# Patient Record
Sex: Female | Born: 1995 | Race: Black or African American | Hispanic: No | Marital: Single | Smoking: Never smoker
Health system: Southern US, Community
[De-identification: ages and names within clinical notes are randomized; demographics above are authoritative.]

## PROBLEM LIST (undated history)

## (undated) DIAGNOSIS — E669 Obesity, unspecified: Secondary | ICD-10-CM

## (undated) DIAGNOSIS — I10 Essential (primary) hypertension: Secondary | ICD-10-CM

## (undated) DIAGNOSIS — L83 Acanthosis nigricans: Secondary | ICD-10-CM

## (undated) HISTORY — DX: Acanthosis nigricans: L83

## (undated) HISTORY — DX: Obesity, unspecified: E66.9

---
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
## 2017-01-01 ENCOUNTER — Encounter (INDEPENDENT_AMBULATORY_CARE_PROVIDER_SITE_OTHER): Payer: Self-pay | Admitting: Family

## 2017-01-01 ENCOUNTER — Ambulatory Visit (INDEPENDENT_AMBULATORY_CARE_PROVIDER_SITE_OTHER): Payer: Medicaid Other | Admitting: Family

## 2017-01-01 VITALS — BP 132/70 | HR 108 | Wt 178.4 lb

## 2017-01-01 DIAGNOSIS — L83 Acanthosis nigricans: Secondary | ICD-10-CM

## 2017-01-01 DIAGNOSIS — I10 Essential (primary) hypertension: Secondary | ICD-10-CM | POA: Diagnosis not present

## 2017-01-01 DIAGNOSIS — Z68.41 Body mass index (BMI) pediatric, greater than or equal to 95th percentile for age: Secondary | ICD-10-CM

## 2017-01-01 DIAGNOSIS — IMO0001 Reserved for inherently not codable concepts without codable children: Secondary | ICD-10-CM

## 2017-01-01 DIAGNOSIS — E669 Obesity, unspecified: Secondary | ICD-10-CM

## 2017-01-01 DIAGNOSIS — F54 Psychological and behavioral factors associated with disorders or diseases classified elsewhere: Secondary | ICD-10-CM

## 2017-01-01 DIAGNOSIS — E1065 Type 1 diabetes mellitus with hyperglycemia: Secondary | ICD-10-CM | POA: Diagnosis not present

## 2017-01-01 LAB — POCT GLUCOSE (DEVICE FOR HOME USE): POC Glucose: 98 mg/dl (ref 70–99)

## 2017-01-01 LAB — POCT GLYCOSYLATED HEMOGLOBIN (HGB A1C): HEMOGLOBIN A1C: 8.7

## 2017-01-01 NOTE — Patient Instructions (Addendum)
Basal Changes  12am: 1.35--> 1.30  4am: 1.40--> 1.35 8am: 1.30--> 1.25 5pm: 1.45  Sensitivity Changes  8am:30--> 35 10am: 40  10pm: 50   Will write letter to have water with you at all times. Also keep snacks. Heat is fine   Follow up in 3 months

## 2017-01-01 NOTE — Progress Notes (Signed)
Pediatric Endocrinology Diabetes Consultation Follow-up Visit  Chief Complaint: Follow-up type 1 diabetes  Little, Effie Shy, MD   HPI: Brandy Parks  is a 21 y.o. female presenting for follow-up of type 1 diabetes.  She is accompanied to this visit by her mother.  11. Ricci was admitted to the [facility] on 10/01/10 with new onset DM. She had hyperglycemia to 491 but not DKA. Her HbA1c was 12.8% and her C-peptide was 0.64 (normal 0.8-3.0). Makynlee was short, quite obese, and had acanthosis nigricans c/w T2DM. However, her insulin requirement was more c/w T1DM and her GAD was positive at 13.5 and her Pancreatic Islet Cell Ab was positive at >80. She was started on MDI with Lantus and Novolog. She was transitioned to Medtronic 530G insulin pump on 12/26/13.  2. Since last visit to [facility] on 03/18, she has been well. Liliyana states that things have been going pretty well.   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 She reports that she has noticed how much her blood sugars impact her work now. When she is high or low, her teachers get very concerned and sometimes wont let her continue with the days work. She feels like she has a lot of fluctuation in her blood sugars based on how busy her day is. She is wearing Medtronic 530g insulin pump, she does not wear a CGM sensor currently. She reports she is changing her site every 3/4 days.   She recently was seen in the ER for abscess on her leg and underarm. The abscess to her leg was drained and is healing fine. The abscess under her arm has resolved. She is taking 2.5 mg of Lisinopril but forgets to take it 3 days per week.   Insulin regimen: Basal Rates 12AM 1.35  4AM 1.40  8AM 1.30  530pm 1.45       Insulin to Carbohydrate Ratio 12AM 15  6AM 9  8:30AM 10          Insulin Sensitivity Factor 12AM 50  8AM  30  10AM 40         Target Blood Glucose 12AM 150  6AM 120  8AM 150         Hypoglycemia: Able to  feel low blood sugars.  No glucagon needed recently.  Blood glucose download: Checking Bg 3.7 times per day. Avg Bg 206  - She is using 56% basal insulin and 44% bolus insulin. She is bolusing 2.4 times per day on average.   - She is Above range 51%, Below range 14%   - Pattern of lows in the morning.  Med-alert ID: Not currently wearing. Pump sites: Using abdomen and buttocks.  Changing sites every 3 days Annual labs due: 06/2017 Ophthalmology:  Had eval 03/2015; required new prescription but no concerns of retinopathy   3. ROS: Greater than 10 systems reviewed with pertinent positives listed in HPI, otherwise neg. General: Reports good energy and good appetite.  Eyes: Recent change in prescription glasses, no retinopathy Respiratory: Denies SOB, cough, congestion Cardiac: Denies tachycardia, palpitations, chest pain.  Genitourinary: No polyuria or polydipsia.  Periods are monthly Psychiatric: Normal affect. Denies anxiety and depression.  Skin: No rash, or skin breakdown.  Neuro: denies tingling of feet currently, no pain Endo: denies polyuria, polydipsia.   Past Medical History:   Past Medical History:  Diagnosis Date  . Acanthosis nigricans, acquired   . Diabetes mellitus   . Hypertension   . Obesity     Current  Outpatient Prescriptions on File Prior to Visit  Medication Sig Dispense Refill  . ACCU-CHEK FASTCLIX LANCETS MISC 1 each by Does not apply route as directed. Check sugar 6 x daily 204 each 6  . glucagon 1 MG injection Use for Severe Hypoglycemia . Inject '1mg'$  intramuscularly if unresponsive, unable to swallow, unconscious and/or has seizure 2 kit 2  . glucose blood (BAYER CONTOUR NEXT TEST) test strip Check glucose 6x daily connects to insulin pump 600 each 4  . Insulin Human (INSULIN PUMP) SOLN Inject into the skin continuous.    Marland Kitchen lisinopril (PRINIVIL,ZESTRIL) 2.5 MG tablet take 1 tablet by mouth once daily 30 tablet 5  . NOVOLOG 100 UNIT/ML injection USE 300 UNITS  IN INSULIN PUMP EVERY 48 HOURS 50 mL 6  . cephALEXin (KEFLEX) 500 MG capsule Take 1 capsule (500 mg total) by mouth 4 (four) times daily. (Patient not taking: Reported on 01/01/2017) 28 capsule 0  . cyclobenzaprine (FLEXERIL) 5 MG tablet Take 1 tablet (5 mg total) by mouth 3 (three) times daily as needed for muscle spasms. (Patient not taking: Reported on 07/02/2016) 12 tablet 0  . ibuprofen (ADVIL,MOTRIN) 600 MG tablet Take 1 tablet (600 mg total) by mouth every 8 (eight) hours as needed for mild pain. (Patient not taking: Reported on 07/02/2016) 30 tablet 0  . loratadine (CLARITIN) 10 MG tablet Take 1 tablet (10 mg total) by mouth daily. (Patient not taking: Reported on 07/02/2016) 30 tablet 0  . NOVOLOG FLEXPEN 100 UNIT/ML FlexPen INJECT 50 UNITS 3 TIME DAILY WITH MEALS (Patient not taking: Reported on 09/30/2016) 5 pen 6  . ranitidine (ZANTAC) 150 MG tablet Take 1 tablet (150 mg total) by mouth 2 (two) times daily. (Patient not taking: Reported on 09/30/2016) 60 tablet 6   No current facility-administered medications on file prior to visit.     No Known Allergies  Surgical History: No past surgical history on file.  Family History:  Family History  Problem Relation Age of Onset  . Obesity Mother   . Hypothyroidism Mother   . Hypertension Mother   . Obesity Maternal Aunt   . Hypertension Maternal Aunt   . Obesity Maternal Grandmother   . Hypertension Maternal Grandmother   . Kidney disease Maternal Grandmother   . Heart disease Maternal Grandfather   . Hypertension Maternal Grandfather   . Cancer Paternal Grandfather   . Diabetes Maternal Uncle   . Healthy Father      Social History: Lives with: maternal grandparents, mother [employer]. Wants to start Cosmetology school   Physical Exam:  Vitals:   01/01/17 0934  BP: 132/70  Pulse: (!) 108  Weight: 178 lb 6.4 oz (80.9 kg)   BP 132/70   Pulse (!) 108   Wt 178 lb 6.4 oz (80.9 kg)   BMI 37.19 kg/m  Body mass index:  body mass index is 37.19 kg/m. No height on file for this encounter.  PHYSICAL EXAM  General: Well developed, obese African American female in no acute distress.  She appears stated age.  Head: Normocephalic, atraumatic.   Eyes:  Pupils equal and round. EOMI.   Sclera white.  No eye drainage.   Ears/Nose/Mouth/Throat: Nares patent, no nasal drainage.  Normal dentition, mucous membranes moist.  Oropharynx intact. Neck: supple, no cervical lymphadenopathy, no thyromegaly Cardiovascular: regular rate, normal S1/S2, no murmurs Respiratory: No increased work of breathing.  Lungs clear to auscultation bilaterally.  No wheezes. Abdomen: soft, nontender, nondistended. Normal bowel sounds.  No appreciable masses.  Extremities: warm, well perfused, cap refill < 2 sec.   Musculoskeletal: Normal muscle mass.  Normal strength.  No foot deformities or tenderness to palpation Skin: warm, dry.  No rash.  No discharge or erythema present.  Neurologic: alert and oriented, normal speech and gait. Sensation grossly intact in feet bilaterally  Labs:   Results for orders placed or performed in visit on 01/01/17  POCT HgB A1C  Result Value Ref Range   Hemoglobin A1C 8.7   POCT Glucose (Device for Home Use)  Result Value Ref Range   Glucose Fasting, POC  70 - 99 mg/dL   POC Glucose 98 70 - 99 mg/dl    Assessment/Plan: Kimiko is a 21 y.o. female with type 1 diabetes in sub optimal control. She is having more lows recently, especially in the morning following correction doses late at night. She needs to decrease her basal rate and sensitivity factors. She also needs to make sure to bolus at dinner to avoid hyperglycemia prior to bedtime.    1. Type I diabetes mellitus, uncontrolled - POCT Glucose (CBG) obtained today; A1C as above.  - Check Bg at least 4 times per day.  - Bolus for blood sugars and for carbs.  - Keep glucose with you at all times.  - Educated on importance of bolusing prior to meals. -  Reviewed pump and blood sugar download.    2. Insulin pump titration Basal Rates 12AM 1.35--> 1.30   4AM 1.40--> 1.35  8AM 1.30--> 1.25  530pm 1.45       Insulin to Carbohydrate Ratio: NO CHANGE 12AM 15  6AM 9  8:30AM 10          Insulin Sensitivity Factor 12AM 50  8AM  30--> 35  10AM 40  10 PM 50       3. Maladaptive Behaviors - Discussed importance of diabetes care and barriers to performing diabetes care.  - Encouraged to bolus before meal to avoid forgetting to bolus after meal.  - Answered all questions   4. Hypertension Encouraged to take Lisinopril daily and not miss doses  - Discussed risk factors of HTN with diabetes.   5. Obesity  - Discussed healthy life habits such as daily exercise, better diet and limiting the amount she goes out to eat.     Follow-up:   3 months.     Hermenia Bers, FNP-C  This visit lasted >25 minutes. More then 50% of the visit was devoted to counseling and education.

## 2017-01-12 ENCOUNTER — Telehealth (INDEPENDENT_AMBULATORY_CARE_PROVIDER_SITE_OTHER): Payer: Self-pay | Admitting: Family

## 2017-01-12 NOTE — Telephone Encounter (Signed)
°  Who's calling (name and relationship to patient) : Alan RipperKandrick (mom) Best contact number: [phone] Provider they see: Ovidio [facility]enser  Reason for call: [facility].  She stated she can pick up when ready.  Please call.     PRESCRIPTION REFILL ONLY  Name of prescription:  Pharmacy:

## 2017-01-13 ENCOUNTER — Encounter (INDEPENDENT_AMBULATORY_CARE_PROVIDER_SITE_OTHER): Payer: Self-pay | Admitting: *Deleted

## 2017-01-13 NOTE — Telephone Encounter (Signed)
Spoke to mother, advised letter is ready to be picked up.

## 2017-02-17 ENCOUNTER — Emergency Department (HOSPITAL_COMMUNITY)
Admission: EM | Admit: 2017-02-17 | Discharge: 2017-02-17 | Disposition: A | Discharge status: Home / Self Care | Payer: Medicaid Other | Attending: Emergency Medicine | Admitting: Emergency Medicine

## 2017-02-17 ENCOUNTER — Encounter (HOSPITAL_COMMUNITY): Payer: Self-pay | Admitting: Emergency Medicine

## 2017-02-17 DIAGNOSIS — I10 Essential (primary) hypertension: Secondary | ICD-10-CM | POA: Insufficient documentation

## 2017-02-17 DIAGNOSIS — Z9641 Presence of insulin pump (external) (internal): Secondary | ICD-10-CM | POA: Diagnosis not present

## 2017-02-17 DIAGNOSIS — R739 Hyperglycemia, unspecified: Secondary | ICD-10-CM | POA: Diagnosis present

## 2017-02-17 DIAGNOSIS — E1065 Type 1 diabetes mellitus with hyperglycemia: Secondary | ICD-10-CM | POA: Insufficient documentation

## 2017-02-17 LAB — COMPREHENSIVE METABOLIC PANEL
ALT: 14 U/L (ref 14–54)
ANION GAP: 12 (ref 5–15)
AST: 15 U/L (ref 15–41)
Albumin: 3.5 g/dL (ref 3.5–5.0)
Alkaline Phosphatase: 84 U/L (ref 38–126)
BUN: 9 mg/dL (ref 6–20)
CHLORIDE: 99 mmol/L — AB (ref 101–111)
CO2: 21 mmol/L — AB (ref 22–32)
Calcium: 9.5 mg/dL (ref 8.9–10.3)
Creatinine, Ser: 0.78 mg/dL (ref 0.44–1.00)
Glucose, Bld: 552 mg/dL (ref 65–99)
POTASSIUM: 4.3 mmol/L (ref 3.5–5.1)
SODIUM: 132 mmol/L — AB (ref 135–145)
Total Bilirubin: 0.5 mg/dL (ref 0.3–1.2)
Total Protein: 6.8 g/dL (ref 6.5–8.1)

## 2017-02-17 LAB — URINALYSIS, ROUTINE W REFLEX MICROSCOPIC
BILIRUBIN URINE: NEGATIVE
HGB URINE DIPSTICK: NEGATIVE
Ketones, ur: 5 mg/dL — AB
LEUKOCYTES UA: NEGATIVE
NITRITE: NEGATIVE
PROTEIN: NEGATIVE mg/dL
Specific Gravity, Urine: 1.027 (ref 1.005–1.030)
WBC UA: NONE SEEN WBC/hpf (ref 0–5)
pH: 6 (ref 5.0–8.0)

## 2017-02-17 LAB — CBC WITH DIFFERENTIAL/PLATELET
Basophils Absolute: 0 10*3/uL (ref 0.0–0.1)
Basophils Relative: 0 %
EOS ABS: 0.2 10*3/uL (ref 0.0–0.7)
Eosinophils Relative: 2 %
HCT: 35.7 % — ABNORMAL LOW (ref 36.0–46.0)
Hemoglobin: 11.4 g/dL — ABNORMAL LOW (ref 12.0–15.0)
LYMPHS PCT: 33 %
Lymphs Abs: 2.7 10*3/uL (ref 0.7–4.0)
MCH: 25.1 pg — AB (ref 26.0–34.0)
MCHC: 31.9 g/dL (ref 30.0–36.0)
MCV: 78.6 fL (ref 78.0–100.0)
MONOS PCT: 4 %
Monocytes Absolute: 0.3 10*3/uL (ref 0.1–1.0)
NEUTROS PCT: 61 %
Neutro Abs: 5 10*3/uL (ref 1.7–7.7)
PLATELETS: 324 10*3/uL (ref 150–400)
RBC: 4.54 MIL/uL (ref 3.87–5.11)
RDW: 13.6 % (ref 11.5–15.5)
WBC: 8.2 10*3/uL (ref 4.0–10.5)

## 2017-02-17 LAB — CBG MONITORING, ED: Glucose-Capillary: 527 mg/dL (ref 65–99)

## 2017-02-17 LAB — POC URINE PREG, ED: PREG TEST UR: NEGATIVE

## 2017-02-17 MED ORDER — SODIUM CHLORIDE 0.9 % IV BOLUS (SEPSIS)
1000.0000 mL | Freq: Once | INTRAVENOUS | Status: AC
  Administered 2017-02-17: 1000 mL via INTRAVENOUS

## 2017-02-17 NOTE — ED Notes (Signed)
PT states understanding of care given, follow up care. PT ambulated from ED to car with a steady gait.  

## 2017-02-17 NOTE — ED Provider Notes (Signed)
[facility]-EMERGENCY DEPT Provider Note   CSN: [id number] Arrival date & time: 02/17/17  0009     History   Chief Complaint Chief Complaint  Patient presents with  . Hyperglycemia    HPI Brandy Parks is a 21 y.o. female.  The history is provided by the patient.  Hyperglycemia  Blood sugar level PTA:  >600 Severity:  Moderate Onset quality:  Gradual Timing:  Constant Progression:  Partially resolved Chronicity:  Recurrent Diabetes status:  Controlled with insulin Context: insulin pump use   Relieved by:  Insulin Associated symptoms: dizziness   Associated symptoms: no abdominal pain, no fever and no vomiting    Pt with known h/o Diabetes, has insulin pump in place She reports over past day she has noted increased blood glucose This usually occurs with menstrual cycle No fever/vomiting She moved her insulin pump to her leg and she confirmed it is working She reports dizziness but no other acute complaints  Past Medical History:  Diagnosis Date  . Acanthosis nigricans, acquired   . Diabetes mellitus   . Hypertension   . Obesity     Patient Active Problem List   Diagnosis Date Noted  . 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 Essential hypertension, benign   . Acquired acanthosis nigricans   . Noncompliance   . Inadequate parental supervision and control   . Type I diabetes mellitus, uncontrolled (HCC) 01/10/2015  . Skin abscess 12/06/2014  . Insulin pump titration 02/08/2014  . Nocturnal hypoglycemia 02/23/2013  . Adjustment disorder 11/18/2011  . Acanthosis nigricans, acquired   . Type I (juvenile type) diabetes mellitus without mention of complication, uncontrolled 10/30/2010  . Obesity 10/30/2010    History reviewed. No pertinent surgical history.  OB History    No data available       Home Medications    Prior to Admission medications   Medication Sig Start Date End Date Taking? Authorizing Provider  ACCU-CHEK  FASTCLIX LANCETS MISC 1 each by Does not apply route as directed. Check sugar 6 x daily 06/23/13   [name], MD  cephALEXin (KEFLEX) 500 MG capsule Take 1 capsule (500 mg total) by mouth 4 (four) times daily. Patient not taking: Reported on 01/01/2017 11/07/16   Roxy Horseman, PA-C  cyclobenzaprine (FLEXERIL) 5 MG tablet Take 1 tablet (5 mg total) by mouth 3 (three) times daily as needed for muscle spasms. Patient not taking: Reported on 07/02/2016 12/11/15   Earley Favor, NP  glucagon 1 MG injection Use for Severe Hypoglycemia . Inject 1mg  intramuscularly if unresponsive, unable to swallow, unconscious and/or has seizure 04/02/16   Gretchen Short, NP  glucose blood (BAYER CONTOUR NEXT TEST) test strip Check glucose 6x daily connects to insulin pump 12/06/14   [name], MD  ibuprofen (ADVIL,MOTRIN) 600 MG tablet Take 1 tablet (600 mg total) by mouth every 8 (eight) hours as needed for mild pain. Patient not taking: Reported on 07/02/2016 12/11/15   Earley Favor, NP  Insulin Human (INSULIN PUMP) SOLN Inject into the skin continuous.    [provider]  lisinopril (PRINIVIL,ZESTRIL) 2.5 MG tablet take 1 tablet by mouth once daily 12/25/16   [name], MD  loratadine (CLARITIN) 10 MG tablet Take 1 tablet (10 mg total) by mouth daily. Patient not taking: Reported on 07/02/2016 11/29/13   Marcellina Millin, MD  NOVOLOG 100 UNIT/ML injection USE 300 UNITS IN INSULIN PUMP EVERY 48 HOURS 12/16/16   [name], MD  NOVOLOG FLEXPEN 100 UNIT/ML FlexPen INJECT 50  UNITS 3 TIME DAILY WITH MEALS Patient not taking: Reported on 09/30/2016 06/23/16   Dessa PhiBadik, Jennifer, MD  ranitidine (ZANTAC) 150 MG tablet Take 1 tablet (150 mg total) by mouth 2 (two) times daily. Patient not taking: Reported on 09/30/2016 06/23/13   Dessa PhiBadik, Jennifer, MD    Family History Family History  Problem Relation Age of Onset  . Obesity Mother   . Hypothyroidism Mother   . Hypertension Mother   . Obesity Maternal  Aunt   . Hypertension Maternal Aunt   . Obesity Maternal Grandmother   . Hypertension Maternal Grandmother   . Kidney disease Maternal Grandmother   . Heart disease Maternal Grandfather   . Hypertension Maternal Grandfather   . Cancer Paternal Grandfather   . Diabetes Maternal Uncle   . Healthy Father     Social History Social History  Substance Use Topics  . Smoking status: Never Smoker  . Smokeless tobacco: Never Used  . Alcohol use No     Allergies   Patient has no known allergies.   Review of Systems Review of Systems  Constitutional: Negative for fever.  Gastrointestinal: Negative for abdominal pain and vomiting.  Neurological: Positive for dizziness.  All other systems reviewed and are negative.    Physical Exam Updated Vital Signs BP (!) 136/99 (BP Location: Left Arm)   Pulse 87   Temp 98.3 F (36.8 C) (Oral)   Resp 14   Ht 1.448 m (4\' 9" )   Wt 76.7 kg (169 lb)   LMP 02/17/2017 (Exact Date)   SpO2 99%   BMI 36.57 kg/m   Physical Exam CONSTITUTIONAL: Well developed/well nourished, smiling, no distress HEAD: Normocephalic/atraumatic EYES: EOMI/PERRL ENMT: Mucous membranes moist NECK: supple no meningeal signs SPINE/BACK:entire spine nontender CV: S1/S2 noted, no murmurs/rubs/gallops noted LUNGS: Lungs are clear to auscultation bilaterally, no apparent distress ABDOMEN: soft, nontender, no rebound or guarding, bowel sounds noted throughout abdomen GU:no cva tenderness NEURO: Pt is awake/alert/appropriate, moves all extremitiesx4.  No facial droop.   EXTREMITIES: pulses normal/equal, full ROM SKIN: warm, color normal PSYCH: no abnormalities of mood noted, alert and oriented to situation   ED Treatments / Results  Labs (all labs ordered are listed, but only abnormal results are displayed) Labs Reviewed  CBC WITH DIFFERENTIAL/PLATELET - Abnormal; Notable for the following:       Result Value   Hemoglobin 11.4 (*)    HCT 35.7 (*)    MCH 25.1  (*)    All other components within normal limits  COMPREHENSIVE METABOLIC PANEL - Abnormal; Notable for the following:    Sodium 132 (*)    Chloride 99 (*)    CO2 21 (*)    Glucose, Bld 552 (*)    All other components within normal limits  URINALYSIS, ROUTINE W REFLEX MICROSCOPIC - Abnormal; Notable for the following:    Color, Urine COLORLESS (*)    Glucose, UA >=500 (*)    Ketones, ur 5 (*)    Bacteria, UA RARE (*)    Squamous Epithelial / LPF 0-5 (*)    All other components within normal limits  CBG MONITORING, ED - Abnormal; Notable for the following:    Glucose-Capillary 527 (*)    All other components within normal limits  CBG MONITORING, ED  POC URINE PREG, ED    EKG  EKG Interpretation None       Radiology No results found.  Procedures Procedures (including critical care time)  Medications Ordered in ED Medications  sodium chloride 0.9 %  bolus 1,000 mL (1,000 mLs Intravenous New Bag/Given 02/17/17 0157)  sodium chloride 0.9 % bolus 1,000 mL (1,000 mLs Intravenous New Bag/Given 02/17/17 0157)     Initial Impression / Assessment and Plan / ED Course  I have reviewed the triage vital signs and the nursing notes.  Pertinent labs   results that were available during my care of the patient were reviewed by me and considered in my medical decision making (see chart for details).     1:48 AM Pt well appearing Will give IV fluids and reassess Low suspicion for DKA at this time 4:32 AM Pt improved She was resting comfortably She is able to check her glucose with her insulin pump and it is now in the 300s Will d/c home We discussed strict ER return precautions   Final Clinical Impressions(s) / ED Diagnoses   Final diagnoses:  Hyperglycemia    New Prescriptions New Prescriptions   No medications on file     Zadie Rhine, MD 02/17/17 [phone]

## 2017-02-17 NOTE — ED Notes (Signed)
ED Provider at bedside. 

## 2017-02-17 NOTE — ED Triage Notes (Signed)
Pt to ED with c/o hyperglycemia.  Pt st's her meter was reading high.  Pt is on a insulin pump and has adjusted it but sugar continues to be high.

## 2017-04-03 ENCOUNTER — Encounter (INDEPENDENT_AMBULATORY_CARE_PROVIDER_SITE_OTHER): Payer: Self-pay | Admitting: Family

## 2017-04-03 ENCOUNTER — Ambulatory Visit (INDEPENDENT_AMBULATORY_CARE_PROVIDER_SITE_OTHER): Payer: Medicaid Other | Admitting: Family

## 2017-04-03 VITALS — BP 130/88 | HR 80 | Ht 58.47 in | Wt 176.0 lb

## 2017-04-03 DIAGNOSIS — E669 Obesity, unspecified: Secondary | ICD-10-CM

## 2017-04-03 DIAGNOSIS — E1065 Type 1 diabetes mellitus with hyperglycemia: Secondary | ICD-10-CM

## 2017-04-03 DIAGNOSIS — Z68.41 Body mass index (BMI) pediatric, greater than or equal to 95th percentile for age: Secondary | ICD-10-CM | POA: Diagnosis not present

## 2017-04-03 DIAGNOSIS — I10 Essential (primary) hypertension: Secondary | ICD-10-CM

## 2017-04-03 DIAGNOSIS — IMO0001 Reserved for inherently not codable concepts without codable children: Secondary | ICD-10-CM

## 2017-04-03 DIAGNOSIS — Z4681 Encounter for fitting and adjustment of insulin pump: Secondary | ICD-10-CM

## 2017-04-03 DIAGNOSIS — L83 Acanthosis nigricans: Secondary | ICD-10-CM | POA: Diagnosis not present

## 2017-04-03 LAB — POCT GLYCOSYLATED HEMOGLOBIN (HGB A1C): Hemoglobin A1C: 9.3

## 2017-04-03 LAB — POCT GLUCOSE (DEVICE FOR HOME USE): Glucose Fasting, POC: 156 mg/dL — AB (ref 70–99)

## 2017-04-03 MED ORDER — INSULIN ASPART 100 UNIT/ML ~~LOC~~ SOLN: SUBCUTANEOUS | 6 refills | Status: DC

## 2017-04-03 NOTE — Progress Notes (Signed)
Pediatric Endocrinology Diabetes Consultation Follow-up Visit  Chief Complaint: Follow-up type 1 diabetes  Little, Brandy Shy, MD   HPI: Brandy Parks  is a 21 y.o. female presenting for follow-up of type 1 diabetes.  She is accompanied to this visit by her mother.  73. Brandy Parks was admitted to the [facility] on 10/01/10 with new onset DM. She had hyperglycemia to 491 but not DKA. Her HbA1c was 12.8% and her C-peptide was 0.64 (normal 0.8-3.0). Brandy Parks was short, quite obese, and had acanthosis nigricans c/w T2DM. However, her insulin requirement was more c/w T1DM and her GAD was positive at 13.5 and her Pancreatic Islet Cell Ab was positive at >80. She was started on MDI with Lantus and Novolog. She was transitioned to Medtronic 530G insulin pump on 12/26/13.  2. Since last visit to [facility] on 06/18, she has been well. She went to the ER on 02/17/2017 for hyperglycemia. She was given fluids and insulin and discharged without admission.   Brandy Parks reports that she has not done as well with her diabetes care since her last visit. She is working at [employer] and doing online school. She has been very busy and has not focused on taking care of her diabetes as much. She knows she needs to check her blood sugar more and make sure to bolus. She also feels like her blood sugars are running higher in general. She has a Medtronic 530g but want to upgrade her insulin pump soon. She is not wearing a CGM sensor.   She is taking 2.5 mg of Lisinopril. She reports that she takes it most days of the week.   Insulin regimen: Basal Rates 12AM 1.30  4AM 1.35  8AM 1.25  530pm 1.45       Insulin to Carbohydrate Ratio 12AM 15  6AM 9  8:30AM 10          Insulin Sensitivity Factor 12AM 50  8AM  30  10AM 40         Target Blood Glucose 12AM 150  6AM 120  8AM 150         Hypoglycemia: Able to feel low blood sugars.  No glucagon needed recently.  Blood glucose download: Checking Bg 3.1  times per day. Avg Bg 232  - She is in range 25%. Above range 70% and below range 5%  - She is using 51% basal, 49% bolus.  Med-alert ID: Not currently wearing. Pump sites: Using abdomen and buttocks.  Changing sites every 3 days Annual labs due: 06/2017 Ophthalmology:  2018    3. ROS: Greater than 10 systems reviewed with pertinent positives listed in HPI, otherwise neg. General: She feels well. She has good energy and appetite.  Eyes: Denies vision changes. No blurry vision.  Respiratory: No SOB. No trouble breathing.  Cardiac: Denies tachycardia, palpitations, chest pain.  Genitourinary: No polyuria or polydipsia.  Periods are monthly Psychiatric: Normal affect. Denies anxiety and depression.  Skin: No rash, or skin breakdown.  Neuro: denies tingling of feet currently, no pain Endo: denies polyuria, polydipsia.   Past Medical History:   Past Medical History:  Diagnosis Date  . Acanthosis nigricans, acquired   . Diabetes mellitus   . Hypertension   . Obesity     Current Outpatient Prescriptions on File Prior to Visit  Medication Sig Dispense Refill  . ACCU-CHEK FASTCLIX LANCETS MISC 1 each by Does not apply route as directed. Check sugar 6 x daily 204 each 6  . glucagon 1 MG  injection Use for Severe Hypoglycemia . Inject 49m intramuscularly if unresponsive, unable to swallow, unconscious and/or has seizure (Patient taking differently: Inject 1 mg into the muscle once as needed. Use for Severe Hypoglycemia  if unresponsive, unable to swallow, unconscious and/or has seizure) 2 kit 2  . glucose blood (BAYER CONTOUR NEXT TEST) test strip Check glucose 6x daily connects to insulin pump 600 each 4  . Insulin Human (INSULIN PUMP) SOLN Inject into the skin continuous.    .Marland Kitchenlisinopril (PRINIVIL,ZESTRIL) 2.5 MG tablet take 1 tablet by mouth once daily 30 tablet 5  . NOVOLOG FLEXPEN 100 UNIT/ML FlexPen INJECT 50 UNITS 3 TIME DAILY WITH MEALS (Patient not taking: Reported on 09/30/2016) 5  pen 6  . ranitidine (ZANTAC) 150 MG tablet Take 1 tablet (150 mg total) by mouth 2 (two) times daily. (Patient not taking: Reported on 09/30/2016) 60 tablet 6   No current facility-administered medications on file prior to visit.     No Known Allergies  Surgical History: No past surgical history on file.  Family History:  Family History  Problem Relation Age of Onset  . Obesity Mother   . Hypothyroidism Mother   . Hypertension Mother   . Obesity Maternal Aunt   . Hypertension Maternal Aunt   . Obesity Maternal Grandmother   . Hypertension Maternal Grandmother   . Kidney disease Maternal Grandmother   . Heart disease Maternal Grandfather   . Hypertension Maternal Grandfather   . Cancer Paternal Grandfather   . Diabetes Maternal Uncle   . Healthy Father      Social History: Lives with: maternal grandparents, mother Online classes. Works at [employer]  Vitals:   04/03/17 1008  BP: 130/88  Pulse: 80  Weight: 176 lb (79.8 kg)  Height: 4' 10.47" (1.485 m)   BP 130/88   Pulse 80   Ht 4' 10.47" (1.485 m)   Wt 176 lb (79.8 kg)   BMI 36.20 kg/m  Body mass index: body mass index is 36.2 kg/m. Blood pressure percentiles are 97 % systolic and 99 % diastolic based on the August 2017 AAP Clinical Practice Guideline. Blood pressure percentile targets: 90: 122/76, 95: 127/80, 95 + 12 mmHg: 139/92. This reading is in the Stage 1 hypertension range (BP >= 130/80).  PHYSICAL EXAM  General: Well developed, well nourished but obese female in no acute distress.  Appears stated age. She is alert and oriented.  Head: Normocephalic, atraumatic.   Eyes:  Pupils equal and round. EOMI.   Sclera white.  No eye drainage.   Ears/Nose/Mouth/Throat: Nares patent, no nasal drainage.  Normal dentition, mucous membranes moist.  Oropharynx intact. Neck: supple, no cervical lymphadenopathy, no thyromegaly Cardiovascular: regular rate, normal S1/S2, no murmurs Respiratory: No  increased work of breathing.  Lungs clear to auscultation bilaterally.  No wheezes. Abdomen: soft, nontender, nondistended. Normal bowel sounds.  No appreciable masses  Extremities: warm, well perfused, cap refill < 2 sec.   Musculoskeletal: Normal muscle mass.  Normal strength Skin: warm, dry.  No rash or lesions.+ Acanthosis to posterior neck.  Neurologic: alert and oriented, normal speech and gait   Labs:   Results for orders placed or performed in visit on 04/03/17  POCT Glucose (Device for Home Use)  Result Value Ref Range   Glucose Fasting, POC 156 (A) 70 - 99 mg/dL   POC Glucose  70 - 99 mg/dl  POCT HgB A1C  Result Value Ref Range   Hemoglobin A1C 9.3  Assessment/Plan: Lauramae is a 21 y.o. female with type 1 diabetes in poor and worsening control. Kynlie has not been taking good care of her diabetes over the last 3 months. She has been busy with work and school which has led to missing blood sugar checks and bolusing. She needs a stronger basal rate. Her A1c has increased to 9.3%.   1. Type I diabetes mellitus, uncontrolled - Continue Medtronic 530g  - Rotate insulin pump sites.  - Advised to bolus for all food - A1c as above.  - Glucose as above.,   2. Insulin pump titration - Spent extensive time reviewing pump download, blood sugars and carb intake to make insulin pump changes.  Basal Rates 12AM  1.30 -->1.35  4AM  1.35--> 1.40   8AM 1.25--> 1.30  530pm 1.45--> 1.55        Insulin to Carbohydrate Ratio: NO CHANGE 12AM 15  6AM 9  8:30AM 10          Insulin Sensitivity Factor--> No changes  12AM 50  8AM  35  10AM 40  10 PM 50       3. Maladaptive Behaviors - Advised that she must bolus with all carbs.  - Discussed new life challenges and balancing diabetes   4. Hypertension - Advised to take lisinopril every morning - If blood pressure is still elevated at next visit, will increase to 5 mg per day.   5. Obesity  - Reviewed diet and made  suggestions for improvement.  - Exercise at least 1 hour per day.     Follow-up:   3 months.   I have spent >25 minutes with >50% of time in counseling, education and instruction. When a patient is on insulin, intensive monitoring of blood glucose levels is necessary to avoid hyperglycemia and hypoglycemia. Severe hyperglycemia/hypoglycemia can lead to hospital admissions and be life threatening.    Hermenia Bers,  FNP-C  Pediatric Specialist  [address] [place]  [place], [address]  Tele: [phone]

## 2017-04-03 NOTE — Patient Instructions (Signed)
-   Pump changes   - Basal   - 12am: 1.30--> 1.35  - 4am: 1.35--> 1.45   - 8am: 1.25--> 1.30   - 1.45--> 1.55  - Check bg at least 4 x per day  - Follow up in 3 months.

## 2017-05-19 ENCOUNTER — Encounter (INDEPENDENT_AMBULATORY_CARE_PROVIDER_SITE_OTHER): Payer: Self-pay | Admitting: Family

## 2017-05-19 ENCOUNTER — Telehealth (INDEPENDENT_AMBULATORY_CARE_PROVIDER_SITE_OTHER): Payer: Self-pay | Admitting: Family

## 2017-05-19 NOTE — Telephone Encounter (Signed)
Patient came by office to pick up later and have pump downloaded.

## 2017-05-19 NOTE — Telephone Encounter (Signed)
°  Who's calling (name and relationship to patient) : Urban GibsonGillies, Lashanda Best contact number: [phone] Provider they see: Ovidio KinSpenser, NP Reason for call: Patient calling to report the low sugar readings she has been having. As well as needing a note for work stating its okay for her to have water and snacks.

## 2017-07-03 ENCOUNTER — Ambulatory Visit (INDEPENDENT_AMBULATORY_CARE_PROVIDER_SITE_OTHER): Payer: Medicaid Other | Admitting: Family

## 2017-07-03 ENCOUNTER — Encounter (INDEPENDENT_AMBULATORY_CARE_PROVIDER_SITE_OTHER): Payer: Self-pay | Admitting: Family

## 2017-07-03 VITALS — BP 118/74 | HR 90 | Ht 58.27 in | Wt 174.6 lb

## 2017-07-03 DIAGNOSIS — Z4681 Encounter for fitting and adjustment of insulin pump: Secondary | ICD-10-CM | POA: Diagnosis not present

## 2017-07-03 DIAGNOSIS — IMO0001 Reserved for inherently not codable concepts without codable children: Secondary | ICD-10-CM

## 2017-07-03 DIAGNOSIS — E1065 Type 1 diabetes mellitus with hyperglycemia: Secondary | ICD-10-CM | POA: Diagnosis not present

## 2017-07-03 DIAGNOSIS — I10 Essential (primary) hypertension: Secondary | ICD-10-CM | POA: Diagnosis not present

## 2017-07-03 DIAGNOSIS — F54 Psychological and behavioral factors associated with disorders or diseases classified elsewhere: Secondary | ICD-10-CM

## 2017-07-03 DIAGNOSIS — L83 Acanthosis nigricans: Secondary | ICD-10-CM

## 2017-07-03 LAB — POCT GLUCOSE (DEVICE FOR HOME USE): Glucose Fasting, POC: 244 mg/dL — AB (ref 70–99)

## 2017-07-03 LAB — POCT GLYCOSYLATED HEMOGLOBIN (HGB A1C): HEMOGLOBIN A1C: 8.6

## 2017-07-03 NOTE — Patient Instructions (Addendum)
-   BOLUS!! Especially at night  - Check your blood sugar at least 4 x per day  - Carb ratio cahnge   8am--> 10--> 9  - Sensitivity change   - 9pm: 50--> 40   - Check your insurance!   - Let me know when your pump upgrade is due   - Follow up in 3 months.   A1C is 8.6%. Good work

## 2017-07-05 ENCOUNTER — Encounter (INDEPENDENT_AMBULATORY_CARE_PROVIDER_SITE_OTHER): Payer: Self-pay | Admitting: Family

## 2017-07-05 NOTE — Progress Notes (Signed)
Pediatric Endocrinology Diabetes Consultation Follow-up Visit  Chief Complaint: Follow-up type 1 diabetes  Little, Effie Shy, MD   HPI: Anyiah Barsch  is a 21 y.o. female presenting for follow-up of type 1 diabetes.  She is accompanied to this visit by her mother.  13. Isola was admitted to the [facility] on 10/01/10 with new onset DM. She had hyperglycemia to 491 but not DKA. Her HbA1c was 12.8% and her C-peptide was 0.64 (normal 0.8-3.0). Monte was short, quite obese, and had acanthosis nigricans c/w T2DM. However, her insulin requirement was more c/w T1DM and her GAD was positive at 13.5 and her Pancreatic Islet Cell Ab was positive at >80. She was started on MDI with Lantus and Novolog. She was transitioned to Medtronic 530G insulin pump on 12/26/13.  2. Since last visit to [facility] on 09/18, she has been well. No hospitalizations  Maysie feels like she has done better with her diabetes care since last visit because she has devoted more time to focusing on caring for herself. She is taking classes online and is working full time. She has developed a better schedule for bolusing and checking her blood sugars. She wants to get a new insulin pump and has started looking at the ones available. She really wants a CGM.   She feels like her blood sugars have been better overall. She runs higher at night and is not having many lows. She is using Medtronic 530g insulin pump and is changing her site every 3 days.   She is taking 2.5 mg of lisinopril per day. She is rarely missing any doses.   Insulin regimen: Basal Rates 12AM 1.30  4AM 1.45  8AM 1.30  530pm 1.55       Insulin to Carbohydrate Ratio 12AM 15  6AM 9  8:30AM 10          Insulin Sensitivity Factor 12AM 50  8AM  35  10AM 40  10pm 50      Target Blood Glucose 12AM 150  6AM 120  8AM 150         Hypoglycemia: Able to feel low blood sugars.  No glucagon needed recently.  Insulin pump download: Avg Bg 209.  Checking 2.5 times per day   - Target Range. In range 35%, Above range 61% and below range 4%  - Using 52 units per day. 61% basal and 39% bolus   - Pattern of hyperglycemia between 5pm-1am.  Med-alert ID: Not currently wearing. Pump sites: Using abdomen and buttocks.  Changing sites every 3 days Annual labs due: 02/2018  Ophthalmology:  2018    3. ROS: Greater than 10 systems reviewed with pertinent positives listed in HPI, otherwise neg. General: She reports good energy and appetite. She has lost 2 pounds.  Eyes: Denies vision changes. No blurry vision.  Respiratory: No SOB. No trouble breathing.  Cardiac: Denies tachycardia, palpitations, chest pain.  Genitourinary: No polyuria or polydipsia.  Periods are monthly Psychiatric: Normal affect. Denies anxiety and depression.  Skin: No rash, or skin breakdown.  Neuro: denies tingling of feet currently, no pain Endo: denies polyuria, polydipsia.   Past Medical History:   Past Medical History:  Diagnosis Date  . Acanthosis nigricans, acquired   . Diabetes mellitus   . Hypertension   . Obesity     Current Outpatient Medications on File Prior to Visit  Medication Sig Dispense Refill  . ACCU-CHEK FASTCLIX LANCETS MISC 1 each by Does not apply route as directed. Check sugar 6  x daily 204 each 6  . glucagon 1 MG injection Use for Severe Hypoglycemia . Inject '1mg'$  intramuscularly if unresponsive, unable to swallow, unconscious and/or has seizure (Patient taking differently: Inject 1 mg into the muscle once as needed. Use for Severe Hypoglycemia  if unresponsive, unable to swallow, unconscious and/or has seizure) 2 kit 2  . glucose blood (BAYER CONTOUR NEXT TEST) test strip Check glucose 6x daily connects to insulin pump 600 each 4  . insulin aspart (NOVOLOG) 100 UNIT/ML injection USE 300 UNITS IN INSULIN PUMP EVERY 48 HOURS 50 mL 6  . lisinopril (PRINIVIL,ZESTRIL) 2.5 MG tablet take 1 tablet by mouth once daily 30 tablet 5  . NOVOLOG  FLEXPEN 100 UNIT/ML FlexPen INJECT 50 UNITS 3 TIME DAILY WITH MEALS (Patient not taking: Reported on 09/30/2016) 5 pen 6  . ranitidine (ZANTAC) 150 MG tablet Take 1 tablet (150 mg total) by mouth 2 (two) times daily. (Patient not taking: Reported on 09/30/2016) 60 tablet 6   No current facility-administered medications on file prior to visit.     No Known Allergies  Surgical History: No past surgical history on file.  Family History:  Family History  Problem Relation Age of Onset  . Obesity Mother   . Hypothyroidism Mother   . Hypertension Mother   . Obesity Maternal Aunt   . Hypertension Maternal Aunt   . Obesity Maternal Grandmother   . Hypertension Maternal Grandmother   . Kidney disease Maternal Grandmother   . Heart disease Maternal Grandfather   . Hypertension Maternal Grandfather   . Cancer Paternal Grandfather   . Diabetes Maternal Uncle   . Healthy Father      Social History: Lives with: maternal grandparents, mother Online classes. Works at [employer]:   07/03/17 0932  BP: 118/74  Pulse: 90  Weight: 174 lb 9.6 oz (79.2 kg)  Height: 4' 10.27" (1.48 m)   BP 118/74   Pulse 90   Ht 4' 10.27" (1.48 m)   Wt 174 lb 9.6 oz (79.2 kg)   BMI 36.16 kg/m  Body mass index: body mass index is 36.16 kg/m. Blood pressure percentiles are 83 % systolic and 83 % diastolic based on the August 2017 AAP Clinical Practice Guideline. Blood pressure percentile targets: 90: 122/76, 95: 128/80, 95 + 12 mmHg: 140/92.  PHYSICAL EXAM  General: Well developed, well nourished but obese female in no acute distress.  Appears stated age.  Head: Normocephalic, atraumatic.   Eyes:  Pupils equal and round. EOMI.   Sclera white.  No eye drainage.   Ears/Nose/Mouth/Throat: Nares patent, no nasal drainage.  Normal dentition, mucous membranes moist.  Oropharynx intact. Neck: supple, no cervical lymphadenopathy, no thyromegaly Cardiovascular: regular rate, normal  S1/S2, no murmurs Respiratory: No increased work of breathing.  Lungs clear to auscultation bilaterally.  No wheezes. Abdomen: soft, nontender, nondistended. Normal bowel sounds.  No appreciable masses  Extremities: warm, well perfused, cap refill < 2 sec.   Musculoskeletal: Normal muscle mass.  Normal strength Skin: warm, dry.  No rash or lesions.+ Acanthosis to posterior neck.  Neurologic: alert and oriented, normal speech and gait   Labs:   Results for orders placed or performed in visit on 07/03/17  POCT Glucose (Device for Home Use)  Result Value Ref Range   Glucose Fasting, POC 244 (A) 70 - 99 mg/dL   POC Glucose  70 - 99 mg/dl  POCT HgB A1C  Result Value Ref Range   Hemoglobin  A1C 8.6     Assessment/Plan: Cassundra is a 21 y.o. female with type 1 diabetes in poor control on insulin pump therapy. Her A1c is 8.6% today which is better then 9.3% at her last visit but still above the ADA goal of <7.5%. She is having hyperglycemia at night, she needs a stronger carb ratio and correction factor. Transitioning to adult life, looking to get insurance as her Medicaid will end soon.    1. Type I diabetes mellitus, uncontrolled/Hyperglycemia/Elevated A1c  - Continue Medtronic 530g insulin pump.  - Check bg at least 4 x per day  - Discussed importance of accurate carb counting.  - Encouraged to bolus before eating.  - Discussed insulin pump: Medtronic 670g, Tandem Tslim and Omnipod.   - Gave information on all of the above.  - POCT glucose  - POCT A1c  2. Insulin pump titration - Spent extensive time reviewing pump download, blood sugars and carb intake to make insulin pump changes.   Insulin to Carbohydrate Ratio:  12AM 15  6AM 9  8:00AM 10--> 9           Insulin Sensitivity Factor 12AM 50  8AM  35  10AM 40  10 PM 50--> 40        3. Maladaptive Behaviors - Discussed barriers to care.  - Encouraged to begin finding insurance plan  - Answered questions.   4.  Hypertension - Continue 2.5 mg of Lisinopril per day   5. Obesity  - Reviewed diet and made suggestions for changes  - Advised to exercise 1 hour per day.     Follow-up:   3 months.   When a patient is on insulin, intensive monitoring of blood glucose levels is necessary to avoid hyperglycemia and hypoglycemia. Severe hyperglycemia/hypoglycemia can lead to hospital admissions and be life threatening.     Hermenia Bers,  FNP-C  Pediatric Specialist  [address] [place]  [place], [address]  Tele: [phone]

## 2017-08-30 DIAGNOSIS — E1059 Type 1 diabetes mellitus with other circulatory complications: Secondary | ICD-10-CM | POA: Insufficient documentation

## 2017-08-30 DIAGNOSIS — I10 Essential (primary) hypertension: Secondary | ICD-10-CM | POA: Insufficient documentation

## 2017-10-01 ENCOUNTER — Ambulatory Visit (INDEPENDENT_AMBULATORY_CARE_PROVIDER_SITE_OTHER): Payer: Medicaid Other | Admitting: Family

## 2017-10-01 VITALS — BP 118/64 | HR 108 | Ht <= 58 in | Wt 173.8 lb

## 2017-10-01 DIAGNOSIS — Z68.41 Body mass index (BMI) pediatric, greater than or equal to 95th percentile for age: Secondary | ICD-10-CM | POA: Diagnosis not present

## 2017-10-01 DIAGNOSIS — E669 Obesity, unspecified: Secondary | ICD-10-CM | POA: Diagnosis not present

## 2017-10-01 DIAGNOSIS — E1065 Type 1 diabetes mellitus with hyperglycemia: Secondary | ICD-10-CM | POA: Diagnosis not present

## 2017-10-01 DIAGNOSIS — IMO0001 Reserved for inherently not codable concepts without codable children: Secondary | ICD-10-CM

## 2017-10-01 DIAGNOSIS — L83 Acanthosis nigricans: Secondary | ICD-10-CM | POA: Diagnosis not present

## 2017-10-01 DIAGNOSIS — I10 Essential (primary) hypertension: Secondary | ICD-10-CM

## 2017-10-01 DIAGNOSIS — R7309 Other abnormal glucose: Secondary | ICD-10-CM

## 2017-10-01 DIAGNOSIS — F54 Psychological and behavioral factors associated with disorders or diseases classified elsewhere: Secondary | ICD-10-CM | POA: Diagnosis not present

## 2017-10-01 DIAGNOSIS — R739 Hyperglycemia, unspecified: Secondary | ICD-10-CM | POA: Diagnosis not present

## 2017-10-01 LAB — POCT GLYCOSYLATED HEMOGLOBIN (HGB A1C): Hemoglobin A1C: 9

## 2017-10-01 LAB — POCT GLUCOSE (DEVICE FOR HOME USE): POC GLUCOSE: 93 mg/dL (ref 70–99)

## 2017-10-01 NOTE — Patient Instructions (Signed)
-   basal Changes   - 4am: 1.45--> 1.50   - 8am: 1.30--> 1.35  - 5pm: 1.55--> 1.60    - Check bg at least 4 x per day  - Rotate pump sites  - Give bolus insulin before eating

## 2017-10-04 ENCOUNTER — Encounter (INDEPENDENT_AMBULATORY_CARE_PROVIDER_SITE_OTHER): Payer: Self-pay | Admitting: Family

## 2017-10-04 NOTE — Progress Notes (Signed)
Pediatric Endocrinology Diabetes Consultation Follow-up Visit  Chief Complaint: Follow-up type 1 diabetes  Little, Effie Shy, MD   HPI: Brandy Parks  is a 22 y.o. female presenting for follow-up of type 1 diabetes.  She is accompanied to this visit by her mother.  39. Brandy Parks was admitted to the [facility] on 10/01/10 with new onset DM. She had hyperglycemia to 491 but not DKA. Her HbA1c was 12.8% and her C-peptide was 0.64 (normal 0.8-3.0). Brandy Parks was short, quite obese, and had acanthosis nigricans c/w T2DM. However, her insulin requirement was more c/w T1DM and her GAD was positive at 13.5 and her Pancreatic Islet Cell Ab was positive at >80. She was started on MDI with Lantus and Novolog. She was transitioned to Medtronic 530G insulin pump on 12/26/13.  2. Since last visit to [facility] on 12/18, she has been well. No hospitalizations  She reports that she is doing a little bit better with her diabetes care lately. She has a new job at [employer] and her schedule is not as hard there. She is able to check her blood sugar more and has time to bolus when she eats. She admits that she is still not checking as often as she should though. She finds when she boluses before she eats that her blood sugars do not spike as much and she does not forget to bolus as often.   She has also started exercising. She is going to the gym about 3 days per week. Her diet remains the same. She is taking Lisinopril daily to help with blood pressure and protect her kidneys. She misses about 2 doses per week. She hopes to have insurance with her new job and wants to get a new insulin pump.   Insulin regimen: Basal Rates 12AM 1.30  4AM 1.45  8AM 1.30  530pm 1.55       Insulin to Carbohydrate Ratio 12AM 15  6AM 9  8:30AM 10          Insulin Sensitivity Factor 12AM 50  8AM  35  10AM 38  10pm 40      Target Blood Glucose 12AM 150  6AM 120  8AM 150         Hypoglycemia: Able to feel low  blood sugars.  No glucagon needed recently.  Insulin pump download: Avg Bg 239. Checking 2.5 x per day   - Target Range: In range 15%, above range 82% and below range 3%   - Using 58 units per day. 55% basal and 45% bolus  Med-alert ID: Not currently wearing. Pump sites: Using abdomen and buttocks.  Changing sites every 3 days Annual labs due: 02/2018  Ophthalmology:  2018 . DIscussed that she is due for eye exam.    3. ROS: Greater than 10 systems reviewed with pertinent positives listed in HPI, otherwise neg. General: She has good energy and appetite.   Eyes: Denies vision changes. No blurry vision.  Respiratory: No SOB. No trouble breathing.  Cardiac: Denies tachycardia, palpitations, chest pain.  Genitourinary: No polyuria or polydipsia.  Periods are monthly Psychiatric: Normal affect. Denies anxiety and depression.  Skin: No rash, or skin breakdown.  Neuro: denies tingling of feet currently, no pain Endo: denies polyuria, polydipsia.   Past Medical History:   Past Medical History:  Diagnosis Date  . Acanthosis nigricans, acquired   . Diabetes mellitus   . Hypertension   . Obesity     Current Outpatient Medications on File Prior to Visit  Medication Sig Dispense Refill  . ACCU-CHEK FASTCLIX LANCETS MISC 1 each by Does not apply route as directed. Check sugar 6 x daily 204 each 6  . glucagon 1 MG injection Use for Severe Hypoglycemia . Inject 28m intramuscularly if unresponsive, unable to swallow, unconscious and/or has seizure (Patient taking differently: Inject 1 mg into the muscle once as needed. Use for Severe Hypoglycemia  if unresponsive, unable to swallow, unconscious and/or has seizure) 2 kit 2  . glucose blood (BAYER CONTOUR NEXT TEST) test strip Check glucose 6x daily connects to insulin pump 600 each 4  . insulin aspart (NOVOLOG) 100 UNIT/ML injection USE 300 UNITS IN INSULIN PUMP EVERY 48 HOURS 50 mL 6  . lisinopril (PRINIVIL,ZESTRIL) 2.5 MG tablet take 1 tablet by  mouth once daily 30 tablet 5  . loratadine (CLARITIN) 10 MG tablet Take 10 mg by mouth daily.    .Marland KitchenNOVOLOG FLEXPEN 100 UNIT/ML FlexPen INJECT 50 UNITS 3 TIME DAILY WITH MEALS (Patient not taking: Reported on 09/30/2016) 5 pen 6  . ranitidine (ZANTAC) 150 MG tablet Take 1 tablet (150 mg total) by mouth 2 (two) times daily. (Patient not taking: Reported on 09/30/2016) 60 tablet 6   No current facility-administered medications on file prior to visit.     No Known Allergies  Surgical History: No past surgical history on file.  Family History:  Family History  Problem Relation Age of Onset  . Obesity Mother   . Hypothyroidism Mother   . Hypertension Mother   . Obesity Maternal Aunt   . Hypertension Maternal Aunt   . Obesity Maternal Grandmother   . Hypertension Maternal Grandmother   . Kidney disease Maternal Grandmother   . Heart disease Maternal Grandfather   . Hypertension Maternal Grandfather   . Cancer Paternal Grandfather   . Diabetes Maternal Uncle   . Healthy Father      Social History: Lives with: maternal grandparents, mother Online classes. [place]   Physical Exam:  Vitals:   10/01/17 0914  BP: 118/64  Pulse: (!) 108  Weight: 173 lb 12.8 oz (78.8 kg)  Height: 4' 9.75" (1.467 m)   BP 118/64   Pulse (!) 108   Ht 4' 9.75" (1.467 m)   Wt 173 lb 12.8 oz (78.8 kg)   BMI 36.64 kg/m  Body mass index: body mass index is 36.64 kg/m. Blood pressure percentiles are not available for patients who are 18 years or older.  PHYSICAL EXAM  General: Well developed, well nourished but obese female in no acute distress.  Alert and oriented.  Head: Normocephalic, atraumatic.   Eyes:  Pupils equal and round. EOMI.   Sclera white.  No eye drainage.   Ears/Nose/Mouth/Throat: Nares patent, no nasal drainage.  Normal dentition, mucous membranes moist.  Oropharynx intact. Neck: supple, no cervical lymphadenopathy, no thyromegaly Cardiovascular: regular rate, normal S1/S2,  no murmurs Respiratory: No increased work of breathing.  Lungs clear to auscultation bilaterally.  No wheezes. Abdomen: soft, nontender, nondistended. Normal bowel sounds.  No appreciable masses  Extremities: warm, well perfused, cap refill < 2 sec.   Musculoskeletal: Normal muscle mass.  Normal strength Skin: warm, dry.  No rash or lesions. + acanthosis to posterior neck.  Neurologic: alert and oriented, normal speech   Labs:   Results for orders placed or performed in visit on 10/01/17  POCT Glucose (Device for Home Use)  Result Value Ref Range   Glucose Fasting, POC  70 - 99 mg/dL   POC Glucose 93  70 - 99 mg/dl  POCT HgB A1C  Result Value Ref Range   Hemoglobin A1C 9.0     Assessment/Plan: Brandy Parks is a 22 y.o. female with type 1 diabetes in poor and slightly worsening control on insulin pump therapy. Aizza needs to bolus before eating. She also needs to check her blood sugar more frequently or use CGM. She needs more basal insulin throughout the day. Her hemoglobin A1c is 9.0% which is above the AD goal of <7.5%.    1. Type I diabetes mellitus, uncontrolled/Hyperglycemia/Elevated A1c  - Continue Medtronic 530g insulin pump.  - Discussed future technology and available insulin pumps.  - Advised to check bg at least 4 x per day  - Bolus before eating to limit blood sugar spikes.  - Reviewed carb counting.  - POCT glucose  - POCT hemoglobin A1c  - Rotate insulin pump sites to prevent lipohypertrophy.   2. Insulin pump titration - Spent extensive time reviewing pump download, blood sugars and carb intake to make insulin pump changes.  Basal Rates 12AM 1.30  4AM 1.45--> 1. 50   8AM 1.30--> 1. 35  530pm 1.55--> 1.6        3. Maladaptive Behaviors - Discussed importance of checking blood sugars and including blood sugar into insulin calculation  - Discussed barrier to care  - Reviewed insurance options when she is off of Medicaid and importance of having insurance to  afford diabetes supplies.   4. Hypertension - 2.5 mcg of Lisinopril per day   5. Obesity  - Reviewed growth chart.  - Discussed diet and made suggestions for changes/improvements.  - Advised to exercise at least 1 hour per day.     Follow-up:   3 months.    When a patient is on insulin, intensive monitoring of blood glucose levels is necessary to avoid hyperglycemia and hypoglycemia. Severe hyperglycemia/hypoglycemia can lead to hospital admissions and be life threatening.    Hermenia Bers,  FNP-C  Pediatric Specialist  [address] [place]  [place], [address]  Tele: [phone]

## 2017-11-22 ENCOUNTER — Encounter (INDEPENDENT_AMBULATORY_CARE_PROVIDER_SITE_OTHER): Payer: Self-pay | Admitting: Family

## 2017-11-23 ENCOUNTER — Encounter (INDEPENDENT_AMBULATORY_CARE_PROVIDER_SITE_OTHER): Payer: Self-pay | Admitting: Family

## 2018-01-01 ENCOUNTER — Ambulatory Visit (INDEPENDENT_AMBULATORY_CARE_PROVIDER_SITE_OTHER): Payer: Medicaid Other | Admitting: Family

## 2018-01-12 ENCOUNTER — Ambulatory Visit (INDEPENDENT_AMBULATORY_CARE_PROVIDER_SITE_OTHER): Payer: Medicaid Other | Admitting: Family

## 2018-01-12 ENCOUNTER — Encounter (INDEPENDENT_AMBULATORY_CARE_PROVIDER_SITE_OTHER): Payer: Self-pay | Admitting: Family

## 2018-01-12 VITALS — BP 124/72 | HR 80 | Ht 58.47 in | Wt 171.2 lb

## 2018-01-12 DIAGNOSIS — E1065 Type 1 diabetes mellitus with hyperglycemia: Secondary | ICD-10-CM | POA: Diagnosis not present

## 2018-01-12 DIAGNOSIS — R739 Hyperglycemia, unspecified: Secondary | ICD-10-CM

## 2018-01-12 DIAGNOSIS — L83 Acanthosis nigricans: Secondary | ICD-10-CM | POA: Diagnosis not present

## 2018-01-12 DIAGNOSIS — I10 Essential (primary) hypertension: Secondary | ICD-10-CM

## 2018-01-12 DIAGNOSIS — F54 Psychological and behavioral factors associated with disorders or diseases classified elsewhere: Secondary | ICD-10-CM | POA: Diagnosis not present

## 2018-01-12 DIAGNOSIS — R7309 Other abnormal glucose: Secondary | ICD-10-CM

## 2018-01-12 DIAGNOSIS — IMO0001 Reserved for inherently not codable concepts without codable children: Secondary | ICD-10-CM

## 2018-01-12 LAB — POCT GLYCOSYLATED HEMOGLOBIN (HGB A1C): Hemoglobin A1C: 8.4 % — AB (ref 4.0–5.6)

## 2018-01-12 LAB — POCT GLUCOSE (DEVICE FOR HOME USE): POC Glucose: 327 mg/dl — AB (ref 70–99)

## 2018-01-12 NOTE — Progress Notes (Signed)
Pediatric Endocrinology Diabetes Consultation Follow-up Visit  Chief Complaint: Follow-up type 1 diabetes  Little, Brandy Shy, MD   HPI: Brandy Parks  is a 22 y.o. female presenting for follow-up of type 1 diabetes.  She is accompanied to this visit by her mother.  2. Brandy Parks was admitted to the [facility] on 10/01/10 with new onset DM. She had hyperglycemia to 491 but not DKA. Her HbA1c was 12.8% and her C-peptide was 0.64 (normal 0.8-3.0). Brandy Parks was short, quite obese, and had acanthosis nigricans c/w T2DM. However, her insulin requirement was more c/w T1DM and her GAD was positive at 13.5 and her Pancreatic Islet Cell Ab was positive at >80. She was started on MDI with Lantus and Novolog. She was transitioned to Medtronic 530G insulin pump on 12/26/13.  2. Since last visit to [facility] on 10/2017, she has been well. No hospitalizations  She is working at [employer] almost every day. Her hours vary which sometimes makes keeping up with her diabetes care difficult. She is eating less often and feels like that has helped her blood sugars. She is wearing Medtronic 530g insulin pump and wants to upgrade soon. She would also like to get a CGM. She knows she needs to check her blood sugar more and bolus for corrections. She is changing her site every 3-4 days and rotating to multiple places.   She is taking 2.5 mg of Lisinopril per day, she denies missed doses.   Insulin regimen: Basal Rates 12AM 1.35  4AM 1.50  8AM 1.35  530pm 1.60       Insulin to Carbohydrate Ratio 12AM 15  6AM 9  8:30AM 9          Insulin Sensitivity Factor 12AM 50  8AM  35  10AM 38  10pm 40      Target Blood Glucose 12AM 150  6AM 120  8AM 150         Hypoglycemia: Able to feel low blood sugars.  No glucagon needed recently.  Insulin pump download:   - Avg Bg 233. Checking 2 x per day   - Target Range: In target 14%, above target 82% and below target 4%  Med-alert ID: Not currently  wearing. Pump sites: Using abdomen and buttocks.  Changing sites every 3 days Annual labs due: 02/2018  Ophthalmology:  2018 . DIscussed that she is due for eye exam.    3. ROS: Greater than 10 systems reviewed with pertinent positives listed in HPI, otherwise neg. General: She reports good energy and appetite. Weight is stable.   Eyes: Denies vision changes. No blurry vision.  Respiratory: No SOB. No trouble breathing.  Cardiac: Denies tachycardia, palpitations, chest pain.  Genitourinary: No polyuria or polydipsia.  Periods are monthly Psychiatric: Normal affect. Denies anxiety and depression.  Skin: No rash, or skin breakdown.  Neuro: denies tingling of feet currently, no pain Endo: denies polyuria, polydipsia.   Past Medical History:   Past Medical History:  Diagnosis Date  . Acanthosis nigricans, acquired   . Diabetes mellitus   . Hypertension   . Obesity     Current Outpatient Medications on File Prior to Visit  Medication Sig Dispense Refill  . ACCU-CHEK FASTCLIX LANCETS MISC 1 each by Does not apply route as directed. Check sugar 6 x daily 204 each 6  . glucagon 1 MG injection Use for Severe Hypoglycemia . Inject 41m intramuscularly if unresponsive, unable to swallow, unconscious and/or has seizure (Patient taking differently: Inject 1 mg into the  muscle once as needed. Use for Severe Hypoglycemia  if unresponsive, unable to swallow, unconscious and/or has seizure) 2 kit 2  . glucose blood (BAYER CONTOUR NEXT TEST) test strip Check glucose 6x daily connects to insulin pump 600 each 4  . insulin aspart (NOVOLOG) 100 UNIT/ML injection USE 300 UNITS IN INSULIN PUMP EVERY 48 HOURS 50 mL 6  . lisinopril (PRINIVIL,ZESTRIL) 2.5 MG tablet take 1 tablet by mouth once daily 30 tablet 5  . loratadine (CLARITIN) 10 MG tablet Take 10 mg by mouth daily.    Marland Kitchen NOVOLOG FLEXPEN 100 UNIT/ML FlexPen INJECT 50 UNITS 3 TIME DAILY WITH MEALS (Patient not taking: Reported on 09/30/2016) 5 pen 6  .  ranitidine (ZANTAC) 150 MG tablet Take 1 tablet (150 mg total) by mouth 2 (two) times daily. (Patient not taking: Reported on 09/30/2016) 60 tablet 6   No current facility-administered medications on file prior to visit.     No Known Allergies  Surgical History: No past surgical history on file.  Family History:  Family History  Problem Relation Age of Onset  . Obesity Mother   . Hypothyroidism Mother   . Hypertension Mother   . Obesity Maternal Aunt   . Hypertension Maternal Aunt   . Obesity Maternal Grandmother   . Hypertension Maternal Grandmother   . Kidney disease Maternal Grandmother   . Heart disease Maternal Grandfather   . Hypertension Maternal Grandfather   . Cancer Paternal Grandfather   . Diabetes Maternal Uncle   . Healthy Father      Social History: Lives with: maternal grandparents, mother Online classes. [employer].   Physical Exam:  Vitals:   01/12/18 1314  BP: 124/72  Pulse: 80  Weight: 171 lb 3.2 oz (77.7 kg)  Height: 4' 10.47" (1.485 m)   BP 124/72   Pulse 80   Ht 4' 10.47" (1.485 m)   Wt 171 lb 3.2 oz (77.7 kg)   BMI 35.21 kg/m  Body mass index: body mass index is 35.21 kg/m. Blood pressure percentiles are not available for patients who are 18 years or older.  PHYSICAL EXAM  General: Well developed, well nourished female in no acute distress.  Alert and oriented.  Head: Normocephalic, atraumatic.   Eyes:  Pupils equal and round. EOMI.   Sclera white.  No eye drainage.   Ears/Nose/Mouth/Throat: Nares patent, no nasal drainage.  Normal dentition, mucous membranes moist.   Neck: supple, no cervical lymphadenopathy, no thyromegaly Cardiovascular: regular rate, normal S1/S2, no murmurs Respiratory: No increased work of breathing.  Lungs clear to auscultation bilaterally.  No wheezes. Abdomen: soft, nontender, nondistended. Normal bowel sounds.  No appreciable masses  Extremities: warm, well perfused, cap refill < 2 sec.   Musculoskeletal:  Normal muscle mass.  Normal strength Skin: warm, dry.  No rash or lesions. Pump site to abdomen. + acanthosis to neck.  Neurologic: alert and oriented, normal speech, no tremor    Labs:   Results for orders placed or performed in visit on 01/12/18  POCT Glucose (Device for Home Use)  Result Value Ref Range   Glucose Fasting, POC  70 - 99 mg/dL   POC Glucose 327 (A) 70 - 99 mg/dl  POCT HgB A1C  Result Value Ref Range   Hemoglobin A1C 8.4 (A) 4.0 - 5.6 %   HbA1c POC (<> result, manual entry)  4.0 - 5.6 %   HbA1c, POC (prediabetic range)  5.7 - 6.4 %   HbA1c, POC (controlled diabetic range)  0.0 -  7.0 %    Assessment/Plan: Safire is a 22 y.o. female with type 1 diabetes in poor control on insulin pump therapy. Jaylei is not checking her blood sugar frequently enough, she is also forgetting to bolus. However, her hemoglobin A1c has improved now that she is more active at work and eating less. Her hemoglobin A1c is 8.4% which is above the ADA goal of <7% for adults.   1. Type I diabetes mellitus, uncontrolled/Hyperglycemia/Elevated A1c  - Medtronic 530g insulin pump  - Discussed currently available pumps she can upgrade to.  - Discussed CGM technology.  - Advised to bolus 15 minutes before eating  - Check bg at least 4 x per day  - Advised to wear Medical alert ID  - POCT glucose  - POCT hemoglobin A1c    2. Insulin pump in place  - Pump is in place  - no changes to settings today.   3. Maladaptive Behaviors - Advised that she needs to check bg at least 4 x per day  - Discussed work/life/diabetes balance  - Answered questions.   4. Hypertension - Take 2.5 mg of Lisinopril per day     Follow-up:   3 months.   I have spent >25 minutes with >50% of time in counseling, education and instruction. When a patient is on insulin, intensive monitoring of blood glucose levels is necessary to avoid hyperglycemia and hypoglycemia. Severe hyperglycemia/hypoglycemia can lead to  hospital admissions and be life threatening.     Hermenia Bers,  FNP-C  Pediatric Specialist  [address] [place]  [place], [address]  Tele: [phone]

## 2018-01-12 NOTE — Patient Instructions (Signed)
Continue currrent settings  Check your blood sugar   - At least 4 x per day  - Give corrections as needed  - Bolus for all food, before you eat  - Labs at next visit   - A1c is 8.4%   Follow up in 3 months.

## 2018-02-02 ENCOUNTER — Other Ambulatory Visit: Payer: Self-pay | Admitting: Pediatric Endocrinology

## 2018-03-04 ENCOUNTER — Other Ambulatory Visit (INDEPENDENT_AMBULATORY_CARE_PROVIDER_SITE_OTHER): Payer: Self-pay | Admitting: *Deleted

## 2018-03-11 ENCOUNTER — Encounter (HOSPITAL_COMMUNITY): Payer: Self-pay | Admitting: Emergency Medicine

## 2018-03-11 ENCOUNTER — Ambulatory Visit (HOSPITAL_COMMUNITY)
Admission: EM | Admit: 2018-03-11 | Discharge: 2018-03-11 | Disposition: A | Discharge status: Home / Self Care | Payer: Medicaid Other | Attending: Family Medicine | Admitting: Family Medicine

## 2018-03-11 DIAGNOSIS — S46911A Strain of unspecified muscle, fascia and tendon at shoulder and upper arm level, right arm, initial encounter: Secondary | ICD-10-CM | POA: Diagnosis not present

## 2018-03-11 MED ORDER — CYCLOBENZAPRINE HCL 10 MG PO TABS: ORAL_TABLET | ORAL | 0 refills | Status: DC

## 2018-03-11 MED ORDER — NAPROXEN 500 MG PO TABS: 500.0000 mg | ORAL_TABLET | Freq: Two times a day (BID) | ORAL | 0 refills | Status: DC | PRN

## 2018-03-11 NOTE — Discharge Instructions (Signed)
Recommend start Naproxen 500mg  every 12 hours as needed for pain. May take Flexeril 10mg  1/2 to 1 whole tablet every 8 hours as needed for muscle pain. May apply warm compresses to area for comfort. Follow-up in 4 to 5 days if not improving.

## 2018-03-11 NOTE — ED Provider Notes (Signed)
[facility]    CSN: [id number] Arrival date & time: 03/11/18  1052     History   Chief Complaint Chief Complaint  Patient presents with  . Shoulder Pain    HPI Brandy Parks is a 22 y.o. female.   22 year old female presents with right shoulder/upper arm pain. She went bowling 4 days ago and woke up the next morning with her right upper arm sore and painful. No distinct injury that she remembers. She continued to pull and tug at boxes at work the past 2 days which has aggravated her symptoms. Now the pain is traveling down her right arm to her elbow and right biceps muscle is swollen. Denies any fever or numbness but has noticed her fingers on her right hand are more "tight". Denies any previous injury to her shoulder but has had a muscle strain in the past. Has taken Ibuprofen with minimal relief. Other chronic health issues include type 1 diabetes, HTN and seasonal allergies and currently on Insulin pump, Lisinopril and Claritin prn.   The history is provided by the patient.    Past Medical History:  Diagnosis Date  . Acanthosis nigricans, acquired   . Diabetes mellitus   . Hypertension   . Obesity     Patient Active Problem List   Diagnosis Date Noted  . 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 Essential hypertension, benign   . Type I diabetes mellitus, uncontrolled (HCC) 01/10/2015  . Skin abscess 12/06/2014  . Insulin pump titration 02/08/2014  . Acanthosis nigricans, acquired   . Obesity 10/30/2010    History reviewed. No pertinent surgical history.  OB History   None      Home Medications    Prior to Admission medications   Medication Sig Start Date End Date Taking? Authorizing Provider  ACCU-CHEK FASTCLIX LANCETS MISC 1 each by Does not apply route as directed. Check sugar 6 x daily 06/23/13   [name], MD  cyclobenzaprine (FLEXERIL) 10 MG tablet Take 1/2 to 1 whole tablet by mouth every 8 hours as  needed for muscle pain/spasms. 03/11/18   Sudie Grumbling, NP  glucagon 1 MG injection Use for Severe Hypoglycemia . Inject 1mg  intramuscularly if unresponsive, unable to swallow, unconscious and/or has seizure Patient taking differently: Inject 1 mg into the muscle once as needed. Use for Severe Hypoglycemia  if unresponsive, unable to swallow, unconscious and/or has seizure 04/02/16   Gretchen Short, NP  glucose blood (BAYER CONTOUR NEXT TEST) test strip Check glucose 6x daily connects to insulin pump 12/06/14   [name], MD  insulin aspart (NOVOLOG) 100 UNIT/ML injection USE 300 UNITS IN INSULIN PUMP EVERY 48 HOURS 04/03/17   Gretchen Short, NP  lisinopril (PRINIVIL,ZESTRIL) 2.5 MG tablet TAKE 1 TABLET BY MOUTH ONCE DAILY 02/03/18   Gretchen Short, NP  loratadine (CLARITIN) 10 MG tablet Take 10 mg by mouth daily.    [provider]  naproxen (NAPROSYN) 500 MG tablet Take 1 tablet (500 mg total) by mouth 2 (two) times daily as needed for moderate pain. 03/11/18   Sudie Grumbling, NP    Family History Family History  Problem Relation Age of Onset  . Obesity Mother   . Hypothyroidism Mother   . Hypertension Mother   . Obesity Maternal Aunt   . Hypertension Maternal Aunt   . Obesity Maternal Grandmother   . Hypertension Maternal Grandmother   . Kidney disease Maternal Grandmother   . Heart disease Maternal Grandfather   .  Hypertension Maternal Grandfather   . Cancer Paternal Grandfather   . Diabetes Maternal Uncle   . Healthy Father     Social History Social History   Tobacco Use  . Smoking status: Never Smoker  . Smokeless tobacco: Never Used  Substance Use Topics  . Alcohol use: No  . Drug use: No     Allergies   Patient has no known allergies.   Review of Systems Review of Systems  Constitutional: Negative for activity change, appetite change, chills, fatigue and fever.  Respiratory: Negative for cough, chest tightness, shortness of breath and wheezing.    Cardiovascular: Negative for chest pain and palpitations.  Gastrointestinal: Negative for nausea and vomiting.  Musculoskeletal: Positive for myalgias. Negative for joint swelling, neck pain and neck stiffness.  Skin: Negative for color change, pallor, rash and wound.  Allergic/Immunologic: Positive for environmental allergies.  Neurological: Negative for dizziness, tremors, seizures, syncope, weakness, light-headedness, numbness and headaches.  Hematological: Negative for adenopathy. Does not bruise/bleed easily.  Psychiatric/Behavioral: Negative.      Physical Exam Triage Vital Signs ED Triage Vitals  Enc Vitals Group     BP 03/11/18 1100 (!) 130/91     Pulse Rate 03/11/18 1100 91     Resp 03/11/18 1100 18     Temp 03/11/18 1100 97.9 F (36.6 C)     Temp Source 03/11/18 1100 Oral     SpO2 03/11/18 1100 98 %     Weight 03/11/18 1102 181 lb (82.1 kg)     Height --      Head Circumference --      Peak Flow --      Pain Score 03/11/18 1102 5     Pain Loc --      Pain Edu? --      Excl. in GC? --    No data found.  Updated Vital Signs BP (!) 130/91 (BP Location: Left Arm)   Pulse 91   Temp 97.9 F (36.6 C) (Oral)   Resp 18   Wt 181 lb (82.1 kg)   LMP 02/25/2018   SpO2 98%   BMI 37.23 kg/m   Visual Acuity Right Eye Distance:   Left Eye Distance:   Bilateral Distance:    Right Eye Near:   Left Eye Near:    Bilateral Near:     Physical Exam  Constitutional: She is oriented to person, place, and time. She appears well-developed and well-nourished. She is cooperative. She does not appear ill. No distress.  Patient sitting comfortably on exam table in no acute distress.   HENT:  Head: Normocephalic and atraumatic.  Eyes: Conjunctivae and EOM are normal.  Neck: Normal range of motion. Neck supple.  Cardiovascular: Normal rate.  Pulmonary/Chest: Effort normal.  Musculoskeletal: Normal range of motion. She exhibits tenderness.       Right upper arm: She exhibits  tenderness and swelling. She exhibits no edema, no deformity and no laceration.       Arms: Has full range of motion of right arm and shoulder but pain at biceps muscle, especially with flexion of elbow and extension of shoulder. Slightly swollen upper right biceps and tender. No distinct redness. Good distal pulses and capillary refill. No neuro deficits noted.   Lymphadenopathy:    She has no cervical adenopathy.  Neurological: She is alert and oriented to person, place, and time. She has normal strength. No sensory deficit.  Skin: Skin is warm and dry. Capillary refill takes less than 2 seconds. No  rash noted. No erythema.  Psychiatric: She has a normal mood and affect. Her behavior is normal. Judgment and thought content normal.  Vitals reviewed.    UC Treatments / Results  Labs (all labs ordered are listed, but only abnormal results are displayed) Labs Reviewed - No data to display  EKG None  Radiology No results found.  Procedures Procedures (including critical care time)  Medications Ordered in UC Medications - No data to display  Initial Impression / Assessment and Plan / UC Course  I have reviewed the triage vital signs and the nursing notes.  Pertinent labs & imaging results that were available during my care of the patient were reviewed by me and considered in my medical decision making (see chart for details).    Discussed with patient that she probably has a mild biceps muscle strain. Reviewed that imaging not indicated at this time. Recommend trial Naproxen 500mg  twice a day as directed for pain and swelling. May take Flexeril 10mg  as directed prn. May apply heat to area for comfort. Note written for work. Follow-up here in 4 to 5 days if not improving.  Final Clinical Impressions(s) / UC Diagnoses   Final diagnoses:  Muscle strain of right upper arm, initial encounter     Discharge Instructions     Recommend start Naproxen 500mg  every 12 hours as needed for  pain. May take Flexeril 10mg  1/2 to 1 whole tablet every 8 hours as needed for muscle pain. May apply warm compresses to area for comfort. Follow-up in 4 to 5 days if not improving.     ED Prescriptions    Medication Sig Dispense Auth. Provider   naproxen (NAPROSYN) 500 MG tablet Take 1 tablet (500 mg total) by mouth 2 (two) times daily as needed for moderate pain. 20 tablet Sudie Grumbling, NP   cyclobenzaprine (FLEXERIL) 10 MG tablet Take 1/2 to 1 whole tablet by mouth every 8 hours as needed for muscle pain/spasms. 15 tablet Sudie Grumbling, NP     Controlled Substance Prescriptions [place] Controlled Substance Registry consulted? Not Applicable   Sudie Grumbling, NP 03/11/18 2025

## 2018-03-11 NOTE — ED Triage Notes (Signed)
PT went bowling Monday night and has had pain in shoulder since that day.

## 2018-03-30 ENCOUNTER — Encounter (HOSPITAL_COMMUNITY): Payer: Self-pay | Admitting: Emergency Medicine

## 2018-03-30 ENCOUNTER — Ambulatory Visit (HOSPITAL_COMMUNITY)
Admission: EM | Admit: 2018-03-30 | Discharge: 2018-03-30 | Disposition: A | Discharge status: Home / Self Care | Payer: Medicaid Other

## 2018-03-30 DIAGNOSIS — R42 Dizziness and giddiness: Secondary | ICD-10-CM | POA: Diagnosis not present

## 2018-03-30 NOTE — ED Triage Notes (Signed)
Provider triage  

## 2018-03-30 NOTE — Discharge Instructions (Addendum)
Follow-up with your primary care provider should these dizziness episodes reoccur. Go to the ED immediately if you experience any symptoms that we discussed or any concerning symptoms. Adhere to a low salt diet and drink plenty of fluids to stay hydrated. Great job monitoring your sugars! Keep up the good work!

## 2018-03-30 NOTE — ED Provider Notes (Signed)
[facility]    CSN: [id number] Arrival date & time: 03/30/18  1932     History   Chief Complaint Chief Complaint  Patient presents with  . Headache    HPI Brandy Parks is a 22 y.o. female.   Subjective:  Brandy Parks is a 22 y.o. female who is here for evaluation of dizziness. She has had two separate episodes of dizziness today.  She describes the symptoms as an acute onset of lightheadedness. Each episode occurred while she was standing still. Symptoms are exacerbated by nothing that can be identified. She denies any aura.  The dizziness lasted a few seconds and then resolve spontaneously without any specific intervention.  She also reports a mild, throbbing-like headache that occurred immediately after the episode of dizziness which also spontaneously resolved.  She denies any tinnitus, facial/limb paresthesias, slurred speech, chest pain, shortness of breath, palpitations, nausea, vomiting or blurred vision.  She checked her blood sugar this morning after the first episode and it was 88.  She checked her blood sugar after the second episode this afternoon and it was 219. She has no prior history.  She reports checking her blood sugars 4 times a day.  Sugars typically run in the upper 90s to low 100s.          Past Medical History:  Diagnosis Date  . Acanthosis nigricans, acquired   . Diabetes mellitus   . Hypertension   . Obesity     Patient Active Problem List   Diagnosis Date Noted  . 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 Essential hypertension, benign   . Type I diabetes mellitus, uncontrolled (HCC) 01/10/2015  . Skin abscess 12/06/2014  . Insulin pump titration 02/08/2014  . Acanthosis nigricans, acquired   . Obesity 10/30/2010    History reviewed. No pertinent surgical history.  OB History   None      Home Medications    Prior to Admission medications   Medication Sig Start Date End Date Taking?  Authorizing Provider  ACCU-CHEK FASTCLIX LANCETS MISC 1 each by Does not apply route as directed. Check sugar 6 x daily 06/23/13   [name], MD  cyclobenzaprine (FLEXERIL) 10 MG tablet Take 1/2 to 1 whole tablet by mouth every 8 hours as needed for muscle pain/spasms. 03/11/18   Sudie Grumbling, NP  glucagon 1 MG injection Use for Severe Hypoglycemia . Inject 1mg  intramuscularly if unresponsive, unable to swallow, unconscious and/or has seizure Patient taking differently: Inject 1 mg into the muscle once as needed. Use for Severe Hypoglycemia  if unresponsive, unable to swallow, unconscious and/or has seizure 04/02/16   Gretchen Short, NP  glucose blood (BAYER CONTOUR NEXT TEST) test strip Check glucose 6x daily connects to insulin pump 12/06/14   [name], MD  insulin aspart (NOVOLOG) 100 UNIT/ML injection USE 300 UNITS IN INSULIN PUMP EVERY 48 HOURS 04/03/17   Gretchen Short, NP  lisinopril (PRINIVIL,ZESTRIL) 2.5 MG tablet TAKE 1 TABLET BY MOUTH ONCE DAILY 02/03/18   Gretchen Short, NP  loratadine (CLARITIN) 10 MG tablet Take 10 mg by mouth daily.    [provider]  naproxen (NAPROSYN) 500 MG tablet Take 1 tablet (500 mg total) by mouth 2 (two) times daily as needed for moderate pain. 03/11/18   Sudie Grumbling, NP    Family History Family History  Problem Relation Age of Onset  . Obesity Mother   . Hypothyroidism Mother   . Hypertension Mother   . Obesity  Maternal Aunt   . Hypertension Maternal Aunt   . Obesity Maternal Grandmother   . Hypertension Maternal Grandmother   . Kidney disease Maternal Grandmother   . Heart disease Maternal Grandfather   . Hypertension Maternal Grandfather   . Cancer Paternal Grandfather   . Diabetes Maternal Uncle   . Healthy Father     Social History Social History   Tobacco Use  . Smoking status: Never Smoker  . Smokeless tobacco: Never Used  Substance Use Topics  . Alcohol use: No  . Drug use: No     Allergies     Patient has no known allergies.   Review of Systems Review of Systems  Eyes: Negative for visual disturbance.  Respiratory: Negative for shortness of breath.   Cardiovascular: Negative for chest pain and palpitations.  Gastrointestinal: Negative for nausea and vomiting.  Neurological: Positive for dizziness and headaches. Negative for syncope, speech difficulty, weakness and numbness.  All other systems reviewed and are negative.    Physical Exam Triage Vital Signs ED Triage Vitals [03/30/18 2011]  Enc Vitals Group     BP (!) 133/100     Pulse Rate 92     Resp 16     Temp 97.8 F (36.6 C)     Temp Source Oral     SpO2 100 %     Weight      Height      Head Circumference      Peak Flow      Pain Score      Pain Loc      Pain Edu?      Excl. in GC?    No data found.  Updated Vital Signs BP (!) 133/100 (BP Location: Left Arm)   Pulse 92   Temp 97.8 F (36.6 C) (Oral)   Resp 16   SpO2 100%   Visual Acuity Right Eye Distance:   Left Eye Distance:   Bilateral Distance:    Right Eye Near:   Left Eye Near:    Bilateral Near:     Physical Exam  Constitutional: She is oriented to person, place, and time. She appears well-developed and well-nourished.  HENT:  Head: Normocephalic.  Eyes: Pupils are equal, round, and reactive to light. EOM are normal.  Neck: Normal range of motion. Neck supple.  Cardiovascular: Normal rate, regular rhythm and normal heart sounds.  Pulmonary/Chest: Effort normal and breath sounds normal.  Musculoskeletal: Normal range of motion.  Neurological: She is alert and oriented to person, place, and time. She has normal strength. No cranial nerve deficit or sensory deficit. GCS eye subscore is 4. GCS verbal subscore is 5. GCS motor subscore is 6.  Skin: Skin is warm and dry.  Psychiatric: She has a normal mood and affect.     UC Treatments / Results  Labs (all labs ordered are listed, but only abnormal results are displayed) Labs  Reviewed - No data to display  EKG None  Radiology No results found.  Procedures Procedures (including critical care time)  Medications Ordered in UC Medications - No data to display  Initial Impression / Assessment and Plan / UC Course  I have reviewed the triage vital signs and the nursing notes.  Pertinent labs & imaging results that were available during my care of the patient were reviewed by me and considered in my medical decision making (see chart for details).    22 year old female with history of insulin-dependent diabetes presents with acute dizziness.  No  prior history other than the 2 episodes that occurred today.  Blood sugars have been within parameters.  Patient is alert and oriented x3.  She is nontoxic-appearing.  Vital signs stable.  Physical exam unremarkable.  No focal neuro deficits noted.  Advised close monitoring of symptoms for now and follow-up with primary care provider if they continue. Extensively discussed indications for immediate ED follow-up with patient and her mother    Today's evaluation has revealed no signs of a dangerous process. Discussed diagnosis with patient. Patient aware of their diagnosis, possible red flag symptoms to watch out for and need for close follow up. Patient understands verbal and written discharge instructions. Patient comfortable with plan and disposition.  Patient has a clear mental status at this time, good insight into illness (after discussion and teaching) and has clear judgment to make decisions regarding their care.  Documentation was completed with the aid of voice recognition software. Transcription may contain typographical errors. Final Clinical Impressions(s) / UC Diagnoses   Final diagnoses:  Spell of dizziness     Discharge Instructions     Follow-up with your primary care provider should these dizziness episodes reoccur. Go to the ED immediately if you experience any symptoms that we discussed or any  concerning symptoms. Adhere to a low salt diet and drink plenty of fluids to stay hydrated. Great job monitoring your sugars! Keep up the good work!     ED Prescriptions    None     Controlled Substance Prescriptions [place] Controlled Substance Registry consulted? Not Applicable   Lurline IdolMurrill, Ladd Cen, [facility] IncFNP 03/30/18 2035

## 2018-04-08 ENCOUNTER — Telehealth (INDEPENDENT_AMBULATORY_CARE_PROVIDER_SITE_OTHER): Payer: Self-pay | Admitting: Family

## 2018-04-08 NOTE — Telephone Encounter (Signed)
°  Who's calling (name and relationship to patient) : [facility]   Best contact number: [phone]  Provider they see: Ovidio Kin   Reason for call:  Amy called concerning patient insulin pump. No other details left.    PRESCRIPTION REFILL ONLY  Name of prescription:  Pharmacy:

## 2018-04-08 NOTE — Telephone Encounter (Signed)
Spoke to Amy, I advised her that Velna wants a Tslim/Dexcom as opposed to the Medtronic.

## 2018-04-13 ENCOUNTER — Ambulatory Visit (INDEPENDENT_AMBULATORY_CARE_PROVIDER_SITE_OTHER): Payer: Medicaid Other | Admitting: Family

## 2018-04-13 ENCOUNTER — Encounter (INDEPENDENT_AMBULATORY_CARE_PROVIDER_SITE_OTHER): Payer: Self-pay | Admitting: Family

## 2018-04-13 ENCOUNTER — Encounter (INDEPENDENT_AMBULATORY_CARE_PROVIDER_SITE_OTHER): Payer: Self-pay

## 2018-04-13 VITALS — BP 132/72 | HR 100 | Ht 58.35 in | Wt 175.8 lb

## 2018-04-13 DIAGNOSIS — L83 Acanthosis nigricans: Secondary | ICD-10-CM

## 2018-04-13 DIAGNOSIS — E1065 Type 1 diabetes mellitus with hyperglycemia: Secondary | ICD-10-CM

## 2018-04-13 DIAGNOSIS — Z9641 Presence of insulin pump (external) (internal): Secondary | ICD-10-CM

## 2018-04-13 DIAGNOSIS — Z23 Encounter for immunization: Secondary | ICD-10-CM

## 2018-04-13 DIAGNOSIS — F54 Psychological and behavioral factors associated with disorders or diseases classified elsewhere: Secondary | ICD-10-CM

## 2018-04-13 DIAGNOSIS — R7309 Other abnormal glucose: Secondary | ICD-10-CM | POA: Insufficient documentation

## 2018-04-13 DIAGNOSIS — IMO0001 Reserved for inherently not codable concepts without codable children: Secondary | ICD-10-CM

## 2018-04-13 DIAGNOSIS — R739 Hyperglycemia, unspecified: Secondary | ICD-10-CM

## 2018-04-13 LAB — POCT GLUCOSE (DEVICE FOR HOME USE): POC Glucose: 150 mg/dl — AB (ref 70–99)

## 2018-04-13 LAB — POCT GLYCOSYLATED HEMOGLOBIN (HGB A1C): Hemoglobin A1C: 8.2 % — AB (ref 4.0–5.6)

## 2018-04-13 NOTE — Patient Instructions (Addendum)
A1c is 8.2%   Order Tandem Tslim insulin pump and Dexcom CGm   Labs today

## 2018-04-13 NOTE — Progress Notes (Signed)
Pediatric Endocrinology Diabetes Consultation Follow-up Visit  Chief Complaint: Follow-up type 1 diabetes  Patient, No Pcp Per   HPI: Brandy Parks  is a 22 y.o. female presenting for follow-up of type 1 diabetes.  She is accompanied to this visit by her mother.  18. Brandy Parks was admitted to the [facility] on 10/01/10 with new onset DM. She had hyperglycemia to 491 but not DKA. Her HbA1c was 12.8% and her C-peptide was 0.64 (normal 0.8-3.0). Keeshia was short, quite obese, and had acanthosis nigricans c/w T2DM. However, her insulin requirement was more c/w T1DM and her GAD was positive at 13.5 and her Pancreatic Islet Cell Ab was positive at >80. She was started on MDI with Lantus and Novolog. She was transitioned to Medtronic 530G insulin pump on 12/26/13.  2. Since last visit to [facility] on 01/2018, she has been well. No hospitalizations  She started a new job working at [employer] and occasionally works at [employer]. She is also taking online classes currently. She feels like things have been going pretty well except that she broke her meter and has to enter all of her blood sugars into her pump manually. She has a Medtronic 530g insulin pump but plans to upgrade to a Tandem Tslim and get a Dexcom CGM. Her blood sugars have been "pretty good" overall. She has been exercising 2 days per week and eating healthier, she is happy to see she has lost some weight.    She is taking 2.5 mg of Lisinopril per day, she denies missed doses.   Insulin regimen: Basal Rates 12AM 1.75  4AM 1.85  8AM 1.90  530pm 1.95       Insulin to Carbohydrate Ratio 12AM 15  6AM 9  8:30AM 9          Insulin Sensitivity Factor 12AM 50  8AM  35  10AM 38  10pm 40      Target Blood Glucose 12AM 150  6AM 120  8AM 150         Hypoglycemia: Able to feel low blood sugars.  No glucagon needed recently.  Insulin pump download:   - Avg Bg 221. Checking 2 x per day   - Target Range: In target  18%, above target 82% and below target 0%   - Using 65 units per day. 34% bolus and 66% basal   Med-alert ID: Not currently wearing. Pump sites: Using abdomen and buttocks.  Changing sites every 3 days Annual labs due: 04/2019--> done today  Ophthalmology:  2018 . DIscussed that she is due for eye exam.    3. ROS: Greater than 10 systems reviewed with pertinent positives listed in HPI, otherwise neg. General: She has good energy and appetite.  Eyes: Denies vision changes. No blurry vision.  Respiratory: No SOB. No trouble breathing.  Cardiac: Denies tachycardia, palpitations, chest pain.  Genitourinary: No polyuria or polydipsia.  Periods are monthly Psychiatric: Normal affect. Denies anxiety and depression.  Skin: No rash, or skin breakdown.  Neuro: denies tingling of feet currently, no pain Endo: denies polyuria, polydipsia.   Past Medical History:   Past Medical History:  Diagnosis Date  . Acanthosis nigricans, acquired   . Diabetes mellitus   . Hypertension   . Obesity     Current Outpatient Medications on File Prior to Visit  Medication Sig Dispense Refill  . ACCU-CHEK FASTCLIX LANCETS MISC 1 each by Does not apply route as directed. Check sugar 6 x daily 204 each 6  .  glucose blood (BAYER CONTOUR NEXT TEST) test strip Check glucose 6x daily connects to insulin pump 600 each 4  . insulin aspart (NOVOLOG) 100 UNIT/ML injection USE 300 UNITS IN INSULIN PUMP EVERY 48 HOURS 50 mL 6  . lisinopril (PRINIVIL,ZESTRIL) 2.5 MG tablet TAKE 1 TABLET BY MOUTH ONCE DAILY 30 tablet 5  . cyclobenzaprine (FLEXERIL) 10 MG tablet Take 1/2 to 1 whole tablet by mouth every 8 hours as needed for muscle pain/spasms. (Patient not taking: Reported on 04/13/2018) 15 tablet 0  . glucagon 1 MG injection Use for Severe Hypoglycemia . Inject '1mg'$  intramuscularly if unresponsive, unable to swallow, unconscious and/or has seizure (Patient taking differently: Inject 1 mg into the muscle once as needed. Use for  Severe Hypoglycemia  if unresponsive, unable to swallow, unconscious and/or has seizure) 2 kit 2  . loratadine (CLARITIN) 10 MG tablet Take 10 mg by mouth daily.    . naproxen (NAPROSYN) 500 MG tablet Take 1 tablet (500 mg total) by mouth 2 (two) times daily as needed for moderate pain. (Patient not taking: Reported on 04/13/2018) 20 tablet 0   No current facility-administered medications on file prior to visit.     No Known Allergies  Surgical History: No past surgical history on file.  Family History:  Family History  Problem Relation Age of Onset  . Obesity Mother   . Hypothyroidism Mother   . Hypertension Mother   . Obesity Maternal Aunt   . Hypertension Maternal Aunt   . Obesity Maternal Grandmother   . Hypertension Maternal Grandmother   . Kidney disease Maternal Grandmother   . Heart disease Maternal Grandfather   . Hypertension Maternal Grandfather   . Cancer Paternal Grandfather   . Diabetes Maternal Uncle   . Healthy Father      Social History: Lives with: maternal grandparents, mother Online classes. [place].   Physical Exam:  Vitals:   04/13/18 1133  BP: 132/72  Pulse: 100  Weight: 175 lb 12.8 oz (79.7 kg)  Height: 4' 10.35" (1.482 m)   BP 132/72   Pulse 100   Ht 4' 10.35" (1.482 m)   Wt 175 lb 12.8 oz (79.7 kg)   BMI 36.31 kg/m  Body mass index: body mass index is 36.31 kg/m. Blood pressure percentiles are not available for patients who are 18 years or older.  PHYSICAL EXAM  General: Well developed, well nourished female in no acute distress. She is alert and oriented.  Head: Normocephalic, atraumatic.   Eyes:  Pupils equal and round. EOMI.   Sclera white.  No eye drainage.   Ears/Nose/Mouth/Throat: Nares patent, no nasal drainage.  Normal dentition, mucous membranes moist.   Neck: supple, no cervical lymphadenopathy, no thyromegaly Cardiovascular: regular rate, normal S1/S2, no murmurs Respiratory: No increased work of breathing.  Lungs  clear to auscultation bilaterally.  No wheezes. Abdomen: soft, nontender, nondistended. Normal bowel sounds.  No appreciable masses  Extremities: warm, well perfused, cap refill < 2 sec.   Musculoskeletal: Normal muscle mass.  Normal strength Skin: warm, dry.  No rash or lesions. Neurologic: alert and oriented, normal speech, no tremor     Labs:   Results for orders placed or performed in visit on 04/13/18  POCT Glucose (Device for Home Use)  Result Value Ref Range   Glucose Fasting, POC     POC Glucose 150 (A) 70 - 99 mg/dl  POCT glycosylated hemoglobin (Hb A1C)  Result Value Ref Range   Hemoglobin A1C 8.2 (A) 4.0 - 5.6 %  HbA1c POC (<> result, manual entry)     HbA1c, POC (prediabetic range)     HbA1c, POC (controlled diabetic range)      Assessment/Plan: Vylette is a 22 y.o. female with type 1 diabetes in sub optimal control on insulin pump therapy. Devion needs to check her blood sugar more consistently and frequently to improve glucose control. She will switch to Tandem insulin pump with Dexcom CGM which will be very helpful. Her hemoglobin A1c is 8.2% which is higher then the ADA goal of <7%. She is due for annual labs today.   1. Type I diabetes mellitus, uncontrolled/Hyperglycemia/Elevated A1c  - Medtronic 530g insulin pump  - reviewed insulin pump and glucose download with patient. Discussed areas for improvements.  - Bolus 15 minutes before eating to limit blood sugar spikes.  - Rotate pump sites.  - Discussed benefits of CGM therapy and frequent glucose monitoring.  - POCT glucose.  - POCt hemoglobin A1c  - Labs: Lipids, TFT's, Microalbumin     2. Insulin pump in place  - Pump is in place  - no changes to settings today.   3. Maladaptive Behaviors - Discussed barriers to care.  - Encouraged to check blood sugar at least four times per day and bolus when hyperglycemic.  - Answered questions.   4. Hypertension - Take 2.5 mg of Lisinopril per day  5.  Vaccination.  - influenza vaccine given. Counseling provided   I have spent >25  minutes with >50% of time in counseling, education and instruction. When a patient is on insulin, intensive monitoring of blood glucose levels is necessary to avoid hyperglycemia and hypoglycemia. Severe hyperglycemia/hypoglycemia can lead to hospital admissions and be life threatening.    Follow-up:   3 months.     Hermenia Bers,  FNP-C  Pediatric Specialist  [address] [place]  [place], [address]  Tele: [phone]

## 2018-04-14 LAB — LIPID PANEL
CHOL/HDL RATIO: 3 (calc) (ref <5.0)
CHOLESTEROL: 147 mg/dL (ref <170)
HDL: 49 mg/dL (ref >45)
LDL CHOLESTEROL (CALC): 82 mg/dL (ref <110)
Non-HDL Cholesterol (Calc): 98 mg/dL (calc) (ref <120)
TRIGLYCERIDES: 82 mg/dL (ref <90)

## 2018-04-14 LAB — T4, FREE: Free T4: 1 ng/dL (ref 0.8–1.4)

## 2018-04-14 LAB — MICROALBUMIN / CREATININE URINE RATIO
Creatinine, Urine: 220 mg/dL (ref 20–275)
Microalb Creat Ratio: 2 mcg/mg creat (ref <30)
Microalb, Ur: 0.5 mg/dL

## 2018-04-14 LAB — TSH: TSH: 1.57 m[IU]/L

## 2018-05-12 ENCOUNTER — Telehealth (INDEPENDENT_AMBULATORY_CARE_PROVIDER_SITE_OTHER): Payer: Self-pay | Admitting: Family

## 2018-05-12 NOTE — Telephone Encounter (Signed)
Spoke to mother, appt scheduled for next week to go on new pump.

## 2018-05-12 NOTE — Telephone Encounter (Signed)
°  Who's calling (name and relationship to patient) : Kandrick Best contact number:[phone]  Provider they see: Dalbert Garnet Reason for call:called in regards to equipment, states she was advised to call when she got everything, she states an appt wasn't mentioned     PRESCRIPTION REFILL ONLY  Name of prescription:  Pharmacy:

## 2018-05-13 ENCOUNTER — Telehealth (INDEPENDENT_AMBULATORY_CARE_PROVIDER_SITE_OTHER): Payer: Self-pay | Admitting: Family

## 2018-05-13 NOTE — Telephone Encounter (Signed)
°  Who's calling (name and relationship to patient) : Alan Ripper (Mother)  Best contact number: [phone] Provider they see: Ovidio Kin Reason for call: Mom stated pt's pump battery may be dying. Mom stated pt's sugars have been dropping a lot lately.

## 2018-05-14 NOTE — Telephone Encounter (Signed)
Returned TC to mother Alan Ripper, she stated the screw button for the insulin pump is worn out and does not tight battery, wanted to see if we can move appointment up. Advised that I do not have anything available, but I have a button for her insulin pump that she can pick up at the office today. Advised left at the front desk. Mom ok with info given.

## 2018-05-20 ENCOUNTER — Ambulatory Visit (INDEPENDENT_AMBULATORY_CARE_PROVIDER_SITE_OTHER): Payer: Medicaid Other | Admitting: *Deleted

## 2018-05-20 ENCOUNTER — Encounter (INDEPENDENT_AMBULATORY_CARE_PROVIDER_SITE_OTHER): Payer: Self-pay | Admitting: *Deleted

## 2018-05-20 VITALS — BP 128/80 | HR 88 | Ht 58.27 in | Wt 175.8 lb

## 2018-05-20 DIAGNOSIS — E1065 Type 1 diabetes mellitus with hyperglycemia: Secondary | ICD-10-CM

## 2018-05-20 DIAGNOSIS — IMO0001 Reserved for inherently not codable concepts without codable children: Secondary | ICD-10-CM

## 2018-05-20 LAB — POCT GLUCOSE (DEVICE FOR HOME USE): Glucose Fasting, POC: 152 mg/dL — AB (ref 70–99)

## 2018-05-20 NOTE — Progress Notes (Signed)
T-Slim Insulin pump and Dexcom CGM start  Referred by Brandy Bers, FNP Start time 9:00am End time 11:am total time 2 hours  Brandy Parks was here with her mother Brandy Parks for training and start on her new T-slim insulin pump and Dexcom CGM. She was diagnosed with diabetes Type 1 in March 2012 and has been on the [employer]'s 530G insulin pump since June 2015. Transferred insulin pump settings to new insulin pump and added sensor settings.   Review indications for use, contraindications, warnings and precautions of Dexcom CGM.  Contraindications of the Dexcom CGM that if a person is wearing the sensor  and takes acetaminophen or if in the body systems then the Dexcom may give a false readings.  Please remove the Dexcom CGM sensor before any X-ray or CT scan or MRI procedures.    Demonstrated and showed patient to enter blood glucose readings and adjusting the lows and the high alerts on the phone application.  Customize the Dexcom software features and settings based on the provider and patient's needs.    Sensor settings: High Alert                    On       300 mg/dL High repeat                 On       3 hours Rise rate                     Off   Low Alert                     On       75 mg/dL Low Repeat                 On       15 mins Fall Rate                      On  Urgent Low soon On 30 mins Urgent Low  On 55 mg/dL   Signal loss                   On       20 mins No readings                 On       20 mins   Showed and demonstrated patient how to apply a demo Dexcom CGM sensor,  Patient verbalized understanding the steps then proceeded to apply the sensor on.  Patient chose Left Upper arm, cleaned the area using alcohol,  Then applied adhesive in a circular motion,  Applied applicator and inserted the sensor.  Patient tolerated very well the procedure,  Patient started CGM on phone app, was able to pair transmitter and sensor.  The patient should be within 20 feet of the  receiver so the transmitter can communicate to the phone app.  Explained the importance of calibrating the Dexcom CGM in two hours  Showed and demonstrated patient how to calibrate CGM on phone app.  Pump overview: Touch screen and general navigation -Screen On (wake) Quick bolus button -Screen lock- turns off pump screen after each interaction -Touch screen-turns off after 3 accidental screen taps -Home screen and home "T" button -Status, bolus and Options button -My pump screen -Keypad screens numbers and letters -Importance of Active confirmation screens -Icons and symbols on touchscreen   Personal  Profiles  -Creating a new Personal Profile (Basal rates, insulin sensitivity, IC ratio and BG target) - Edit and review, Active, Duplicate, Delete and renaming a Personal Profile  Alert Settings: -Reminders: Low BG, High BG, after Bolus BG, missed bolus -Alerts: Low insulin, auto off  Pump Settings -Quick Bolus: grams or units increments -Pump Volume: Low, Med, High, or vibrate -Screen Options: Screen Timeout, feature lock  -time and date (importance for accuracy of settings and data)  Pump Info  [id number]  Insulin pump settings:  Time Basal Rate Correction factor Carb ratio Target BG   12a 1.75 50 mg/dL 15 150 mg/dL  4a 1.85 50 mg/dL 15 150 mg/dL  6a 1.85 50 mg/dL 9 120 mg/dL  8 1.90 35 mg/dL 9 120 mg/dL  10 1.90 38 mg/dL 9 120 mg/dL  5p 1.95 38 mg/dL 9 120 mg/dL  9p 1.95 38 mg/dL 9 150 mg/dL  10p 1.95 40 mg/dL 9 150 mg/dL   Total Basal 29.6 Units      Duration of insulin   3 hours     Maximum Bolus 20 Units     Carb (for calculation) On      Low Insulin Alert On- 20 Units      Auto Off Off     Quick Bolus Off     Basal IQ On        Loading cartridge -Change cartridge: avoid changing at bedtime, use room temperature insulin, fill syringe, and remove bubbles prior to filling cartridge. -Fill Cartridge (min 95 units and max 300 units) remove air, check for leaks,  and connect to infusion set -fill Tubing (Ensure disconnect from site. Check for leaks) - Fill Cannula -Site reminder -fill estimate volume - Do not add or remove insulin after the Load sequence -removal and disposal of used cartridge and infusion set.   Temporary Basal rates - Pump can be program from 0-250%, 15 mins -72 hours, start and stop a Temp rate  My CGM (if applicable) -Entering transmitter ID, entering sensor code, starting sensor session - 2 hour warm up period - Sensor alerts: high/Lows, rise/fall, end session, set volume - Out of range alerts: must be turned on in order to optimize the safety and performance of Basal IQ feature - CGM graph (change display timeline) and trend arrows  - Optimize connectivity between pump and sensor (pump screen facing out)  Pump/CGM history - Delivery summary, total daily dose, bolus, load BG, alerts and alarms - Session and calibration, alerts and error, complete  Delivering Boluses:  - Standard food bolus, adding multiple carbs, cancelling bolus - 0.05 minimum unit bolus 25 units maximum bolus - Entering a BG value, correction bolus, food bolus with correction - Above/Below Bg target and IOB- Bolus calculator Algorithm - Extended Bolus - On  - Quick Bolus Off  Safety: - Importance of backup plan and supplies to carry at all times: insulin syringe or pen and BG meter (Insulin pump Emergency kit) in case of emergency - Stop and resume insulin delivery - Aseptic/clean technique - Check Bg x daily if not using CGM - Hazard associated with small part and exposure to electromagnetic radiation or MRI - Reminders Low Bg, and High BG retest) site changes or follow DKA protocols - [employer] insulin pump SN warranty info, 24 hour/7 days Technical support [phone]  Basal IQ Technology: - Uses CMG values to predict sensor glucose 30 minutes into future suspends ( stops) insulin delivery if predicted valued < 80 mg/dL - Suspends (stops)  insulin delivery if actual sensor glucose value is <80 mg/dL - Basal rate will automatically resume when CGM values begin to rise - Maximum Time of insulin suspension is 2 hours out of any 2.5 hr period - Basal insulin is either delivering or suspended not adjusted - Basal IQ feature does not replace active diabetes management; treatment of hypoglycemia may need to be adjusted. Discuss changes with provider.   Patient verbalized readiness to start insulin pump.  Followed instructions in insulin pump how to change a cartridge. Filled Cartridge with 300 units, after removing air from cartridge.  Filled tubing and attached cartridge to insulin pump.  Patient inserted cannula on her Left Upper quadrant, she tolerated very well. Checked Blood sugar 129, which required a correction, patient was able to enter Bg and do correction bolus.         Assessment/Plan:       Patient and family participated in hands on training material and asked appropriate questions.        Patient was able to add sensor and pump settings to new pump with no problems.  Patient tolerated very well the sensor and cannula insertions with no problems. Review insulin pump protocols and patient verbalized understanding them.        If any technical questions regarding your insulin pump and or CGM, please call the company [employer] and or Dexcom.        Reminded to calibrate in two hour and first 24 hours to use blood sugar meter for bg readings.        Call our office for any questions regarding your diabetes and or blood sugar readings.        T:connet UN: [email or web address] [id number].

## 2018-05-21 ENCOUNTER — Encounter (HOSPITAL_COMMUNITY): Payer: Self-pay | Admitting: Emergency Medicine

## 2018-05-21 ENCOUNTER — Ambulatory Visit (HOSPITAL_COMMUNITY)
Admission: EM | Admit: 2018-05-21 | Discharge: 2018-05-21 | Disposition: A | Discharge status: Home / Self Care | Payer: Medicaid Other | Attending: Family Medicine | Admitting: Family Medicine

## 2018-05-21 ENCOUNTER — Other Ambulatory Visit: Payer: Self-pay

## 2018-05-21 DIAGNOSIS — E1065 Type 1 diabetes mellitus with hyperglycemia: Secondary | ICD-10-CM | POA: Diagnosis not present

## 2018-05-21 DIAGNOSIS — Z79899 Other long term (current) drug therapy: Secondary | ICD-10-CM | POA: Diagnosis not present

## 2018-05-21 DIAGNOSIS — J069 Acute upper respiratory infection, unspecified: Secondary | ICD-10-CM | POA: Insufficient documentation

## 2018-05-21 DIAGNOSIS — B9789 Other viral agents as the cause of diseases classified elsewhere: Secondary | ICD-10-CM | POA: Diagnosis not present

## 2018-05-21 DIAGNOSIS — I10 Essential (primary) hypertension: Secondary | ICD-10-CM | POA: Insufficient documentation

## 2018-05-21 DIAGNOSIS — Z794 Long term (current) use of insulin: Secondary | ICD-10-CM | POA: Diagnosis not present

## 2018-05-21 DIAGNOSIS — L83 Acanthosis nigricans: Secondary | ICD-10-CM | POA: Insufficient documentation

## 2018-05-21 DIAGNOSIS — Z8249 Family history of ischemic heart disease and other diseases of the circulatory system: Secondary | ICD-10-CM | POA: Insufficient documentation

## 2018-05-21 DIAGNOSIS — J029 Acute pharyngitis, unspecified: Secondary | ICD-10-CM | POA: Diagnosis present

## 2018-05-21 LAB — POCT RAPID STREP A: Streptococcus, Group A Screen (Direct): NEGATIVE

## 2018-05-21 MED ORDER — IBUPROFEN 600 MG PO TABS: 600.0000 mg | ORAL_TABLET | Freq: Four times a day (QID) | ORAL | 0 refills | Status: DC | PRN

## 2018-05-21 MED ORDER — CETIRIZINE HCL 10 MG PO CAPS: 10.0000 mg | ORAL_CAPSULE | Freq: Every day | ORAL | 0 refills | Status: DC

## 2018-05-21 MED ORDER — FLUTICASONE PROPIONATE 50 MCG/ACT NA SUSP: 1.0000 | Freq: Every day | NASAL | 0 refills | Status: AC

## 2018-05-21 MED ORDER — AMOXICILLIN-POT CLAVULANATE 875-125 MG PO TABS: 1.0000 | ORAL_TABLET | Freq: Two times a day (BID) | ORAL | 0 refills | Status: AC

## 2018-05-21 MED ORDER — GUAIFENESIN-DM 100-10 MG/5ML PO SYRP: 5.0000 mL | ORAL_SOLUTION | ORAL | 0 refills | Status: DC | PRN

## 2018-05-21 NOTE — ED Provider Notes (Signed)
[facility]    CSN: [id number] Arrival date & time: 05/21/18  [id number]     History   Chief Complaint Chief Complaint  Patient presents with  . URI    HPI Brandy Parks is a 22 y.o. female history of DM type I, hypertension, Patient is presenting with URI symptoms- congestion, cough, sore throat.  Patient is also had headache, body aches, ear fullness.  Patient's main complaints are overall feeling poor. Symptoms have been going on for 5 to 6 days. Patient has tried TheraFlu, with minimal relief.  Has had intermittent fevers but no persistent fevers.  Denies nausea, vomiting, diarrhea. Denies shortness of breath and chest pain.  Denies history of asthma and shortness of breath.    HPI  Past Medical History:  Diagnosis Date  . Acanthosis nigricans, acquired   . Diabetes mellitus   . Hypertension   . Obesity     Patient Active Problem List   Diagnosis Date Noted  . Hyperglycemia 04/13/2018  . Elevated hemoglobin A1c 04/13/2018  . 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 Essential hypertension, benign   . Type I diabetes mellitus, uncontrolled (HCC) 01/10/2015  . Skin abscess 12/06/2014  . Insulin pump titration 02/08/2014  . Acanthosis nigricans, acquired   . Obesity 10/30/2010    History reviewed. No pertinent surgical history.  OB History   None      Home Medications    Prior to Admission medications   Medication Sig Start Date End Date Taking? Authorizing Provider  ACCU-CHEK FASTCLIX LANCETS MISC 1 each by Does not apply route as directed. Check sugar 6 x daily 06/23/13  Yes Dessa PhiBadik, Jennifer, MD  glucose blood (BAYER CONTOUR NEXT TEST) test strip Check glucose 6x daily connects to insulin pump 12/06/14  Yes Dessa PhiBadik, Jennifer, MD  insulin aspart (NOVOLOG) 100 UNIT/ML injection USE 300 UNITS IN INSULIN PUMP EVERY 48 HOURS 04/03/17  Yes Gretchen ShortBeasley, Spenser, NP  lisinopril (PRINIVIL,ZESTRIL) 2.5 MG tablet TAKE 1 TABLET BY MOUTH  ONCE DAILY 02/03/18  Yes Gretchen ShortBeasley, Spenser, NP  amoxicillin-clavulanate (AUGMENTIN) 875-125 MG tablet Take 1 tablet by mouth every 12 (twelve) hours for 10 days. 05/24/18 06/03/18  Mikhaila Roh C, PA-C  Cetirizine HCl 10 MG CAPS Take 1 capsule (10 mg total) by mouth daily for 10 days. 05/21/18 05/31/18  Wilsie Kern C, PA-C  fluticasone (FLONASE) 50 MCG/ACT nasal spray Place 1-2 sprays into both nostrils daily for 7 days. 05/21/18 05/28/18  Regla Fitzgibbon C, PA-C  glucagon 1 MG injection Use for Severe Hypoglycemia . Inject 1mg  intramuscularly if unresponsive, unable to swallow, unconscious and/or has seizure Patient taking differently: Inject 1 mg into the muscle once as needed. Use for Severe Hypoglycemia  if unresponsive, unable to swallow, unconscious and/or has seizure 04/02/16   Gretchen ShortBeasley, Spenser, NP  guaiFENesin-dextromethorphan (ROBITUSSIN DM) 100-10 MG/5ML syrup Take 5 mLs by mouth every 4 (four) hours as needed for cough. 05/21/18   Demetra Moya C, PA-C  ibuprofen (ADVIL,MOTRIN) 600 MG tablet Take 1 tablet (600 mg total) by mouth every 6 (six) hours as needed. 05/21/18   Leonid Manus, Junius CreamerHallie C, PA-C    Family History Family History  Problem Relation Age of Onset  . Obesity Mother   . Hypothyroidism Mother   . Hypertension Mother   . Obesity Maternal Aunt   . Hypertension Maternal Aunt   . Obesity Maternal Grandmother   . Hypertension Maternal Grandmother   . Kidney disease Maternal Grandmother   . Heart disease Maternal Grandfather   .  Hypertension Maternal Grandfather   . Cancer Paternal Grandfather   . Diabetes Maternal Uncle   . Healthy Father     Social History Social History   Tobacco Use  . Smoking status: Never Smoker  . Smokeless tobacco: Never Used  Substance Use Topics  . Alcohol use: No  . Drug use: No     Allergies   Patient has no known allergies.   Review of Systems Review of Systems  Constitutional: Positive for fever. Negative for activity  change, appetite change, chills and fatigue.  HENT: Positive for congestion, rhinorrhea and sore throat. Negative for ear pain, sinus pressure and trouble swallowing.   Eyes: Negative for discharge and redness.  Respiratory: Positive for cough. Negative for chest tightness and shortness of breath.   Cardiovascular: Negative for chest pain.  Gastrointestinal: Negative for abdominal pain, diarrhea, nausea and vomiting.  Musculoskeletal: Positive for myalgias.  Skin: Negative for rash.  Neurological: Positive for headaches. Negative for dizziness and light-headedness.     Physical Exam Triage Vital Signs ED Triage Vitals  Enc Vitals Group     BP 05/21/18 0928 (!) 133/91     Pulse Rate 05/21/18 0928 91     Resp 05/21/18 0928 18     Temp 05/21/18 0928 98.3 F (36.8 C)     Temp Source 05/21/18 0928 Oral     SpO2 05/21/18 0928 98 %     Weight --      Height --      Head Circumference --      Peak Flow --      Pain Score 05/21/18 0938 5     Pain Loc --      Pain Edu? --      Excl. in GC? --    No data found.  Updated Vital Signs BP (!) 133/91 (BP Location: Left Arm)   Pulse 91   Temp 98.3 F (36.8 C) (Oral)   Resp 18   LMP 05/21/2018 (Approximate)   SpO2 98%   Visual Acuity Right Eye Distance:   Left Eye Distance:   Bilateral Distance:    Right Eye Near:   Left Eye Near:    Bilateral Near:     Physical Exam  Constitutional: She appears well-developed and well-nourished. No distress.  HENT:  Head: Normocephalic and atraumatic.  Bilateral ears without tenderness to palpation of external auricle, tragus and mastoid, EAC's without erythema or swelling, TM's with good bony landmarks and cone of light. Non erythematous.  Nasal mucosa erythematous, swollen turbinates, clear rhinorrhea present bilaterally  Oral mucosa pink and moist, mild tonsillar enlargement with erythema; posterior pharynx patent and erythematous, no uvula deviation or swelling. Normal phonation.    Eyes: Conjunctivae are normal.  Neck: Neck supple.  Cardiovascular: Normal rate and regular rhythm.  No murmur heard. Pulmonary/Chest: Effort normal and breath sounds normal. No respiratory distress.  Breathing comfortably at rest, CTABL, no wheezing, rales or other adventitious sounds auscultated  Abdominal: Soft. There is no tenderness.  Musculoskeletal: She exhibits no edema.  Neurological: She is alert.  Skin: Skin is warm and dry.  Psychiatric: She has a normal mood and affect.  Nursing note and vitals reviewed.    UC Treatments / Results  Labs (all labs ordered are listed, but only abnormal results are displayed) Labs Reviewed  CULTURE, GROUP A STREP [facility])  POCT RAPID STREP A    EKG None  Radiology No results found.  Procedures Procedures (including critical care time)  Medications Ordered  in UC Medications - No data to display  Initial Impression / Assessment and Plan / UC Course  I have reviewed the triage vital signs and the nursing notes.  Pertinent labs & imaging results that were available during my care of the patient were reviewed by me and considered in my medical decision making (see chart for details).     URI symptoms x5 to 6 days, vital signs stable, exam relatively unremarkable, will treat patient for viral illness, discussed possible flulike event fevers early in course of illness, no persistent fevers.  Will recommend continued symptomatic management over the next 3 to 4 days.  Zyrtec, Flonase, cough syrup, NSAIDs.  Provided prescription for Augmentin to fill on Monday if not having any improvement with symptomatic recommendations.Discussed strict return precautions. Patient verbalized understanding and is agreeable with plan.  Final Clinical Impressions(s) / UC Diagnoses   Final diagnoses:  Viral URI with cough     Discharge Instructions     You likely having a viral upper respiratory infection. We recommended symptom control. I expect  your symptoms to start improving in the next 1-2 weeks.   1. Take a daily allergy pill/anti-histamine like Zyrtec, Claritin, or Store brand consistently for 2 weeks  2. For congestion you may try an oral decongestant like Mucinex or sudafed. You may also try intranasal flonase nasal spray or saline irrigations (neti pot, sinus cleanse)  3. For your sore throat you may try cepacol lozenges, salt water gargles, throat spray. Treatment of congestion may also help your sore throat.  4. For cough you may try Robitussen, Mucinex DM  5. Take Tylenol or Ibuprofen to help with body aches/headache/fever-You may take up to 600 mg Ibuprofen every 8 hours with food. You may supplement Ibuprofen with Tylenol [phone] mg every 8 hours.   6. Stay hydrated, drink plenty of fluids to keep throat coated and less irritated  Honey Tea For cough/sore throat try using a honey-based tea. Use 3 teaspoons of honey with juice squeezed from half lemon. Place shaved pieces of ginger into 1/2-1 cup of water and warm over stove top. Then mix the ingredients and repeat every 4 hours as needed.    ED Prescriptions    Medication Sig Dispense Auth. Provider   Cetirizine HCl 10 MG CAPS Take 1 capsule (10 mg total) by mouth daily for 10 days. 10 capsule Husain Costabile C, PA-C   fluticasone (FLONASE) 50 MCG/ACT nasal spray Place 1-2 sprays into both nostrils daily for 7 days. 1 g Honest Safranek C, PA-C   guaiFENesin-dextromethorphan (ROBITUSSIN DM) 100-10 MG/5ML syrup Take 5 mLs by mouth every 4 (four) hours as needed for cough. 118 mL Johnel Yielding C, PA-C   amoxicillin-clavulanate (AUGMENTIN) 875-125 MG tablet Take 1 tablet by mouth every 12 (twelve) hours for 10 days. 20 tablet Monterius Rolf C, PA-C   ibuprofen (ADVIL,MOTRIN) 600 MG tablet Take 1 tablet (600 mg total) by mouth every 6 (six) hours as needed. 30 tablet Ellia Knowlton, [place] C, PA-C     Controlled Substance Prescriptions [place] Controlled Substance Registry  consulted? Not Applicable   Lew Dawes, [place] 05/21/18 1023

## 2018-05-21 NOTE — ED Triage Notes (Signed)
Pt reports headache, body aches, bilateral ear fullness, nasal congestion, cough and sore throat x4 days.  Pt has been taking Theraflu.  They report a fever of 103 at home.

## 2018-05-21 NOTE — Discharge Instructions (Signed)
You likely having a viral upper respiratory infection. We recommended symptom control. I expect your symptoms to start improving in the next 1-2 weeks.   1. Take a daily allergy pill/anti-histamine like Zyrtec, Claritin, or Store brand consistently for 2 weeks  2. For congestion you may try an oral decongestant like Mucinex or sudafed. You may also try intranasal flonase nasal spray or saline irrigations (neti pot, sinus cleanse)  3. For your sore throat you may try cepacol lozenges, salt water gargles, throat spray. Treatment of congestion may also help your sore throat.  4. For cough you may try Robitussen, Mucinex DM  5. Take Tylenol or Ibuprofen to help with body aches/headache/fever-You may take up to 600 mg Ibuprofen every 8 hours with food. You may supplement Ibuprofen with Tylenol [phone] mg every 8 hours.   6. Stay hydrated, drink plenty of fluids to keep throat coated and less irritated  Honey Tea For cough/sore throat try using a honey-based tea. Use 3 teaspoons of honey with juice squeezed from half lemon. Place shaved pieces of ginger into 1/2-1 cup of water and warm over stove top. Then mix the ingredients and repeat every 4 hours as needed.

## 2018-05-23 LAB — CULTURE, GROUP A STREP (THRC)

## 2018-05-24 ENCOUNTER — Encounter (INDEPENDENT_AMBULATORY_CARE_PROVIDER_SITE_OTHER): Payer: Self-pay

## 2018-05-24 ENCOUNTER — Telehealth (HOSPITAL_COMMUNITY): Payer: Self-pay | Admitting: Emergency Medicine

## 2018-05-24 NOTE — Telephone Encounter (Signed)
Culture is positive for non group A Strep germ.  This is a finding of uncertain significance; not the typical 'strep throat' germ.  Pt complains of persistent symptoms. Pt was given augmentin at urgent care visit and states she started taking it this weekend. Pt encouraged to complete course of antibiotics and return for continued symptoms. Pt had all questions answered.

## 2018-06-23 ENCOUNTER — Other Ambulatory Visit (INDEPENDENT_AMBULATORY_CARE_PROVIDER_SITE_OTHER): Payer: Self-pay | Admitting: Family

## 2018-07-15 ENCOUNTER — Ambulatory Visit (INDEPENDENT_AMBULATORY_CARE_PROVIDER_SITE_OTHER): Payer: Medicaid Other | Admitting: Family

## 2018-07-15 ENCOUNTER — Encounter (INDEPENDENT_AMBULATORY_CARE_PROVIDER_SITE_OTHER): Payer: Self-pay | Admitting: Family

## 2018-07-15 VITALS — BP 124/80 | HR 90 | Ht 58.19 in | Wt 180.4 lb

## 2018-07-15 DIAGNOSIS — F54 Psychological and behavioral factors associated with disorders or diseases classified elsewhere: Secondary | ICD-10-CM

## 2018-07-15 DIAGNOSIS — R739 Hyperglycemia, unspecified: Secondary | ICD-10-CM

## 2018-07-15 DIAGNOSIS — R7309 Other abnormal glucose: Secondary | ICD-10-CM

## 2018-07-15 DIAGNOSIS — I10 Essential (primary) hypertension: Secondary | ICD-10-CM

## 2018-07-15 DIAGNOSIS — E1065 Type 1 diabetes mellitus with hyperglycemia: Secondary | ICD-10-CM | POA: Diagnosis not present

## 2018-07-15 DIAGNOSIS — IMO0001 Reserved for inherently not codable concepts without codable children: Secondary | ICD-10-CM

## 2018-07-15 DIAGNOSIS — Z4681 Encounter for fitting and adjustment of insulin pump: Secondary | ICD-10-CM

## 2018-07-15 LAB — POCT GLUCOSE (DEVICE FOR HOME USE): GLUCOSE FASTING, POC: 164 mg/dL — AB (ref 70–99)

## 2018-07-15 LAB — POCT GLYCOSYLATED HEMOGLOBIN (HGB A1C): Hemoglobin A1C: 7.4 % — AB (ref 4.0–5.6)

## 2018-07-15 NOTE — Progress Notes (Addendum)
Pediatric Endocrinology Diabetes Consultation Follow-up Visit  Chief Complaint: Follow-up type 1 diabetes  Patient, No Pcp Per   HPI: Brandy Parks  is a 23 y.o. female presenting for follow-up of type 1 diabetes.  She is accompanied to this visit by her mother.  69. Brandy Parks was admitted to the [facility] on 10/01/10 with new onset DM. She had hyperglycemia to 491 but not DKA. Her HbA1c was 12.8% and her C-peptide was 0.64 (normal 0.8-3.0). Brandy Parks was short, quite obese, and had acanthosis nigricans c/w T2DM. However, her insulin requirement was more c/w T1DM and her GAD was positive at 13.5 and her Pancreatic Islet Cell Ab was positive at >80. She was started on MDI with Lantus and Novolog. She was transitioned to Medtronic 530G insulin pump on 12/26/13.  2. Since last visit to [facility] on 04/2018, she has been well. No hospitalizations  She went to [facility] on 05/21/2018 due to cough, sore throat. Was diagnosed with viral URI, no other issues.   She reports that she has been doing well overall. She is working at [employer] 4 days per week and taking online classes for school. 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   She started Tslim insulin pump on 05/2018, she says that she loves it. Also using Dexcom CGM and happy with it. Feels like blood sugars have been good. She has noticed going low early in the morning and will also having some blood sugar rise at night. Feels like she is doing better with bolusing but usually boluses after eating. Overall she is very happy. She is also excited about the Control IQ technology that will be released soon.   She is taking 2.5 mg of Lisinopril per day.     Insulin regimen: Basal Rates 12AM 1.65  4AM 1.70  8AM 1.80  530pm 1.95  9pm 1.85    Insulin to Carbohydrate Ratio 12AM 15  6AM 9  8:30AM 9          Insulin Sensitivity Factor 12AM 50  8AM  35  10AM 38  10pm 40      Target Blood Glucose 12AM 150  6AM 120  8AM 150          Hypoglycemia: Able to feel low blood sugars.  No glucagon needed recently.  Insulin pump download:   - Using 67 units per day   - Using 61% basal. 39% bolus  Dexcom CGM:   - Avg Bg 190  - Target Range: in target 49%, above target 51% and below target 0%   - 6 low glucose suspension   Med-alert ID: Not currently wearing. Pump sites: Using abdomen and buttocks.  Changing sites every 3 days Annual labs due: 04/2019 Ophthalmology:  2018 . DIscussed that she is due for eye exam.    3. ROS: Greater than 10 systems reviewed with pertinent positives listed in HPI, otherwise neg. General: She reports good energy and appetite. Sleeping well.  Eyes: Denies vision changes. No blurry vision.  Respiratory: No SOB. No trouble breathing.  Cardiac: Denies tachycardia, palpitations, chest pain.  Genitourinary: No polyuria or polydipsia.  Periods are monthly Psychiatric: Normal affect. Denies anxiety and depression.  Skin: No rash, or skin breakdown.  Neuro: denies tingling of feet currently, no pain Endo: denies polyuria, polydipsia.   Past Medical History:   Past Medical History:  Diagnosis Date  . Acanthosis nigricans, acquired   . Diabetes mellitus   . Hypertension   . Obesity  Current Outpatient Medications on File Prior to Visit  Medication Sig Dispense Refill  . ACCU-CHEK FASTCLIX LANCETS MISC 1 each by Does not apply route as directed. Check sugar 6 x daily 204 each 6  . glucagon 1 MG injection Use for Severe Hypoglycemia . Inject '1mg'$  intramuscularly if unresponsive, unable to swallow, unconscious and/or has seizure (Patient taking differently: Inject 1 mg into the muscle once as needed. Use for Severe Hypoglycemia  if unresponsive, unable to swallow, unconscious and/or has seizure) 2 kit 2  . insulin aspart (NOVOLOG) 100 UNIT/ML injection INJECT 300 UNITS INSULIN PUMP EVERY 48 HOURS 50 mL 6  . lisinopril (PRINIVIL,ZESTRIL) 2.5 MG tablet TAKE 1 TABLET BY MOUTH ONCE DAILY 30  tablet 5  . Cetirizine HCl 10 MG CAPS Take 1 capsule (10 mg total) by mouth daily for 10 days. 10 capsule 0  . fluticasone (FLONASE) 50 MCG/ACT nasal spray Place 1-2 sprays into both nostrils daily for 7 days. 1 g 0  . glucose blood (BAYER CONTOUR NEXT TEST) test strip Check glucose 6x daily connects to insulin pump (Patient not taking: Reported on 07/15/2018) 600 each 4  . guaiFENesin-dextromethorphan (ROBITUSSIN DM) 100-10 MG/5ML syrup Take 5 mLs by mouth every 4 (four) hours as needed for cough. (Patient not taking: Reported on 07/15/2018) 118 mL 0  . ibuprofen (ADVIL,MOTRIN) 600 MG tablet Take 1 tablet (600 mg total) by mouth every 6 (six) hours as needed. (Patient not taking: Reported on 07/15/2018) 30 tablet 0   No current facility-administered medications on file prior to visit.     No Known Allergies  Surgical History: No past surgical history on file.  Family History:  Family History  Problem Relation Age of Onset  . Obesity Mother   . Hypothyroidism Mother   . Hypertension Mother   . Obesity Maternal Aunt   . Hypertension Maternal Aunt   . Obesity Maternal Grandmother   . Hypertension Maternal Grandmother   . Kidney disease Maternal Grandmother   . Heart disease Maternal Grandfather   . Hypertension Maternal Grandfather   . Cancer Paternal Grandfather   . Diabetes Maternal Uncle   . Healthy Father      Social History: Lives with: maternal grandparents, mother Online classes. Brandy Parks.   Physical Exam:  Vitals:   07/15/18 0909  BP: 124/80  Pulse: 90  Weight: 180 lb 6.4 oz (81.8 kg)  Height: 4' 10.19" (1.478 m)   BP 124/80   Pulse 90   Ht 4' 10.19" (1.478 m)   Wt 180 lb 6.4 oz (81.8 kg)   BMI 37.46 kg/m  Body mass index: body mass index is 37.46 kg/m. Growth percentile SmartLinks can only be used for patients less than 18 years old.  PHYSICAL EXAM  General: Well developed, well nourished female in no acute distress.  Alert and oriented.  Head:  Normocephalic, atraumatic.   Eyes:  Pupils equal and round. EOMI.   Sclera white.  No eye drainage.   Ears/Nose/Mouth/Throat: Nares patent, no nasal drainage.  Normal dentition, mucous membranes moist.   Neck: supple, no cervical lymphadenopathy, no thyromegaly Cardiovascular: regular rate, normal S1/S2, no murmurs Respiratory: No increased work of breathing.  Lungs clear to auscultation bilaterally.  No wheezes. Abdomen: soft, nontender, nondistended. Normal bowel sounds.  No appreciable masses  Extremities: warm, well perfused, cap refill < 2 sec.   Musculoskeletal: Normal muscle mass.  Normal strength Skin: warm, dry.  No rash or lesions. + pump site  Neurologic: alert and oriented, normal  speech, no tremor      Labs:   Results for orders placed or performed in visit on 07/15/18  POCT Glucose (Device for Home Use)  Result Value Ref Range   Glucose Fasting, POC 164 (A) 70 - 99 mg/dL   POC Glucose    POCT glycosylated hemoglobin (Hb A1C)  Result Value Ref Range   Hemoglobin A1C 7.4 (A) 4.0 - 5.6 %   HbA1c POC (<> result, manual entry)     HbA1c, POC (prediabetic range)     HbA1c, POC (controlled diabetic range)      Assessment/Plan: Brandy Parks is a 23 y.o. female with type 1 diabetes in sub optimal control. She is doing much better with diabetes care since switching to Dexcom CGm and Tandem tslim combination. Having having hypoglycemia around 4-7am and needs basal reduced during this time. Hyperglycemia between 9pm-1am--> will increase basal and correction factor. Her hemoglobin A1c is 7.4% which is slightly higher then ADA goal of <7% for adults.   1. Type I diabetes mellitus, uncontrolled/ 2. Hyperglycemia/ 3. Elevated A1c  - Reviewed insulin pump and blood sugar download. Discussed trend and patterns with patient.  - Encouraged to bolus 15 minutes before eating to limit blood sugar spikes.  - Use temporary basal rates.   - increase during sickness and stress   - Decrease  during exercise.  - Discussed Control Iq therapy.  - Keep glucose available at all times.  - Reviewed s/s of hypoglycemia  - Wear medical alert ID  - POCT glucose and hemoglobin A1c  - Advised to get primary care provider.   4. Insulin pump in place  - Basal Rates 12AM 1.65--> 1.60  4AM 1.70--> 1.625  8AM 1.80  530pm 1.95  9pm 1.85--> 1.90    Insulin to Carbohydrate Ratio 12AM 15  6AM 9  8:30AM 9          Insulin Sensitivity Factor 12AM 50  8AM  35  10AM 38--> 35  10pm 40--> 35        5. Maladaptive Behaviors - Discussed barriers to care.  - Discussed balancing diabetes care with work, school and activity  - Answered questions.   6. Hypertension - 2.5 mg of lisinopril per day.    I have spent >40  minutes with >50% of time in counseling, education and instruction. When a patient is on insulin, intensive monitoring of blood glucose levels is necessary to avoid hyperglycemia and hypoglycemia. Severe hyperglycemia/hypoglycemia can lead to hospital admissions and be life threatening.    Follow-up:   3 months.     Hermenia Bers,  FNP-C  Pediatric Specialist  [address] [place]  [place], [id number]  Tele: [phone]

## 2018-07-15 NOTE — Patient Instructions (Addendum)
Basal Rates 12AM 1.65--> 1.60  4AM 1.70--> 1.625  8AM 1.80-  5pm 1.95  9pm 1.85---> 1.90     Insulin to Carbohydrate Ratio 12AM 15  6AM 9  8:30AM 9          Insulin Sensitivity Factor 12AM 50  8AM  35  10AM 38--> 35  10pm 40--> 35       Bolus before eating.  - [facility] [address] --> [place]

## 2018-08-03 ENCOUNTER — Ambulatory Visit: Payer: Medicaid Other | Admitting: Physician Assistant

## 2018-10-14 ENCOUNTER — Encounter (INDEPENDENT_AMBULATORY_CARE_PROVIDER_SITE_OTHER): Payer: Self-pay | Admitting: Family

## 2018-10-14 ENCOUNTER — Ambulatory Visit (INDEPENDENT_AMBULATORY_CARE_PROVIDER_SITE_OTHER): Payer: Medicaid Other | Admitting: Family

## 2018-10-14 ENCOUNTER — Other Ambulatory Visit: Payer: Self-pay

## 2018-10-14 DIAGNOSIS — Z9641 Presence of insulin pump (external) (internal): Secondary | ICD-10-CM

## 2018-10-14 DIAGNOSIS — I10 Essential (primary) hypertension: Secondary | ICD-10-CM | POA: Diagnosis not present

## 2018-10-14 DIAGNOSIS — R739 Hyperglycemia, unspecified: Secondary | ICD-10-CM | POA: Diagnosis not present

## 2018-10-14 DIAGNOSIS — E1065 Type 1 diabetes mellitus with hyperglycemia: Secondary | ICD-10-CM

## 2018-10-14 NOTE — Progress Notes (Signed)
This is a Pediatric Specialist E-Visit follow up consult provided via Telephone,  Brandy Parks consented to an E-Visit consult today.  Location of patient: Brandy Parks is at home Location of provider: Hermenia Bers, FNP is at Pediatric Office Patient was referred by Brandy ref. provider found   The following participants were involved in this E-Visit: DM type 1   Chief Complain/ Reason for E-Visit today: Type 1 diabetes FU   Total time on call: This visit lasted 14 minutes. More then 50% of the visit was devoted to counseling.   Follow up: 2 months.   Pediatric Endocrinology Diabetes Consultation Follow-up Visit  Chief Complaint: Follow-up type 1 diabetes  Patient, Brandy Parks   HPI: Brandy Parks  is a 23 y.o. female presenting for follow-up of type 1 diabetes.  She is accompanied to this visit by her mother.  38. Brandy Parks was admitted to the [facility] on 10/01/10 with new onset DM. She had hyperglycemia to 491 but not DKA. Her HbA1c was 12.8% and her C-peptide was 0.64 (normal 0.8-3.0). Brandy Parks was short, quite obese, and had acanthosis nigricans c/w T2DM. However, her insulin requirement was more c/w T1DM and her GAD was positive at 13.5 and her Pancreatic Islet Cell Ab was positive at >80. She was started on MDI with Lantus and Novolog. She was transitioned to Medtronic 530G insulin pump on 12/26/13.  2. Since last visit to [facility] on 07/2018, she has been well. Brandy hospitalizations  She has not upgraded her pump to control IQ, she plans to call them today. She likes the tandem insulin pump and Dexcom CGM. She feels like her blood sugars are running high after she eats but she is usually bolusing after she eats. Her blood sugars usually come back down fairly quickly. Overall she feels like things are pretty well.   She is taking 2.5 mg of Lisinopril Parks day.     Insulin regimen: Basal Rates 12AM 1.60  4AM 1.625  8AM 1.80  530pm 1.95  9pm 1.90    Insulin to  Carbohydrate Ratio 12AM 15  6AM 9  8:30AM 9          Insulin Sensitivity Factor 12AM 50  8AM  35  10AM 35  10pm 35      Target Blood Glucose 12AM 150  6AM 120  8AM 150         Hypoglycemia: Able to feel low blood sugars.  Brandy glucagon needed recently.  Insulin pump download:   - Unable to download.  Dexcom CGM:   - Avg 177  - Target Range: In target 50%, above target 49%, and below target 1%   - less variability in blood sugars.  Med-alert ID: Not currently wearing. Pump sites: Using abdomen and buttocks.  Changing sites every 3 days Annual labs due: 04/2019 Ophthalmology:  2018 . DIscussed that she is due for eye exam.    3. ROS: Greater than 10 systems reviewed with pertinent positives listed in HPI, otherwise neg. General: She report good energy and appetite. Sleeping well.  Eyes: Denies vision changes. Brandy blurry vision.  Respiratory: Brandy SOB. Brandy trouble breathing.  Cardiac: Denies tachycardia, palpitations, chest pain.  Genitourinary: Brandy polyuria or polydipsia.  Periods are monthly Psychiatric: Normal affect. Denies anxiety and depression.  Skin: Brandy rash, or skin breakdown.  Neuro: denies tingling of feet currently, Brandy pain Endo: denies polyuria, polydipsia.   Past Medical History:   Past Medical History:  Diagnosis Date  . Acanthosis nigricans,  acquired   . Diabetes mellitus   . Hypertension   . Obesity     Current Outpatient Medications on File Prior to Visit  Medication Sig Dispense Refill  . ACCU-CHEK FASTCLIX LANCETS MISC 1 each by Does not apply route as directed. Check sugar 6 x daily 204 each 6  . Cetirizine HCl 10 MG CAPS Take 1 capsule (10 mg total) by mouth daily for 10 days. 10 capsule 0  . fluticasone (FLONASE) 50 MCG/ACT nasal spray Place 1-2 sprays into both nostrils daily for 7 days. 1 g 0  . glucagon 1 MG injection Use for Severe Hypoglycemia . Inject '1mg'$  intramuscularly if unresponsive, unable to swallow, unconscious and/or has seizure  (Patient taking differently: Inject 1 mg into the muscle once as needed. Use for Severe Hypoglycemia  if unresponsive, unable to swallow, unconscious and/or has seizure) 2 kit 2  . glucose blood (BAYER CONTOUR NEXT TEST) test strip Check glucose 6x daily connects to insulin pump (Patient not taking: Reported on 07/15/2018) 600 each 4  . guaiFENesin-dextromethorphan (ROBITUSSIN DM) 100-10 MG/5ML syrup Take 5 mLs by mouth every 4 (four) hours as needed for cough. (Patient not taking: Reported on 07/15/2018) 118 mL 0  . ibuprofen (ADVIL,MOTRIN) 600 MG tablet Take 1 tablet (600 mg total) by mouth every 6 (six) hours as needed. (Patient not taking: Reported on 07/15/2018) 30 tablet 0  . insulin aspart (NOVOLOG) 100 UNIT/ML injection INJECT 300 UNITS INSULIN PUMP EVERY 48 HOURS 50 mL 6  . lisinopril (PRINIVIL,ZESTRIL) 2.5 MG tablet TAKE 1 TABLET BY MOUTH ONCE DAILY 30 tablet 5   Brandy current facility-administered medications on file prior to visit.     Brandy Known Allergies  Surgical History: Brandy past surgical history on file.  Family History:  Family History  Problem Relation Age of Onset  . Obesity Mother   . Hypothyroidism Mother   . Hypertension Mother   . Obesity Maternal Aunt   . Hypertension Maternal Aunt   . Obesity Maternal Grandmother   . Hypertension Maternal Grandmother   . Kidney disease Maternal Grandmother   . Heart disease Maternal Grandfather   . Hypertension Maternal Grandfather   . Cancer Paternal Grandfather   . Diabetes Maternal Uncle   . Healthy Father      Social History: Lives with: maternal grandparents, mother Online classes. [place].   Physical Exam:  There were Brandy vitals filed for this visit. There were Brandy vitals taken for this visit. Body mass index: body mass index is unknown because there is Brandy height or weight on file. Growth percentile SmartLinks can only be used for patients less than 30 years old.  PHYSICAL EXAM Telehealth visit.  Alert and  oriented.   Labs:   - Last a1c 07/2018: 7.4%     Assessment/Plan: Brandy Parks is a 23 y.o. female with type 1 diabetes in sub optimal control. Her blood sugars are more stable, less hypoglcyemia and variability. She will benefit further when she gets control IQ set up on insulin pump. Brandy changes needed to settings today.   1. Type I diabetes mellitus, uncontrolled/ 2. Hyperglycemia/ 3. Elevated A1c  - Reviewed CGM download. Discussed trends and patterns.  - Bolus 15 minutes before eating to limit blood sugar spikes.  - Rotate pump sites to prevent scar tissue.  - Reviewed sick day protocol  - Discussed signs and symptoms of hypoglycemia.  - Wear medical alert ID at all times.   4. Insulin pump in place   -  Set up control IQ  - Insulin pump in place.   5. Hypertension - 2.5 mg of lisinopril Parks day.    When a patient is on insulin, intensive monitoring of blood glucose levels is necessary to avoid hyperglycemia and hypoglycemia. Severe hyperglycemia/hypoglycemia can lead to hospital admissions and be life threatening.    Follow-up:   2 months.     Brandy Bers,  FNP-C  Pediatric Specialist  [address] [place]  [place], [address]  Tele: [phone]

## 2018-10-14 NOTE — Patient Instructions (Signed)
-  Always have fast sugar with you in case of low blood sugar (glucose tabs, regular juice or soda, candy) -Always wear your ID that states you have diabetes -Always bring your meter to your visit -Call/Email if you want to review blood sugars  - Set up control IQ for insulin pump.

## 2018-11-01 ENCOUNTER — Encounter (INDEPENDENT_AMBULATORY_CARE_PROVIDER_SITE_OTHER): Payer: Self-pay

## 2018-11-15 ENCOUNTER — Encounter (INDEPENDENT_AMBULATORY_CARE_PROVIDER_SITE_OTHER): Payer: Self-pay

## 2018-11-23 ENCOUNTER — Encounter (INDEPENDENT_AMBULATORY_CARE_PROVIDER_SITE_OTHER): Payer: Self-pay

## 2018-11-24 ENCOUNTER — Encounter (INDEPENDENT_AMBULATORY_CARE_PROVIDER_SITE_OTHER): Payer: Self-pay | Admitting: *Deleted

## 2018-11-29 ENCOUNTER — Encounter (INDEPENDENT_AMBULATORY_CARE_PROVIDER_SITE_OTHER): Payer: Self-pay

## 2018-12-16 ENCOUNTER — Encounter (INDEPENDENT_AMBULATORY_CARE_PROVIDER_SITE_OTHER): Payer: Self-pay | Admitting: *Deleted

## 2019-01-03 ENCOUNTER — Ambulatory Visit (INDEPENDENT_AMBULATORY_CARE_PROVIDER_SITE_OTHER): Payer: Medicaid Other | Admitting: Family

## 2019-01-03 ENCOUNTER — Encounter (INDEPENDENT_AMBULATORY_CARE_PROVIDER_SITE_OTHER): Payer: Self-pay | Admitting: *Deleted

## 2019-01-03 ENCOUNTER — Other Ambulatory Visit: Payer: Self-pay

## 2019-01-13 ENCOUNTER — Encounter (INDEPENDENT_AMBULATORY_CARE_PROVIDER_SITE_OTHER): Payer: Self-pay

## 2019-01-20 ENCOUNTER — Encounter (INDEPENDENT_AMBULATORY_CARE_PROVIDER_SITE_OTHER): Payer: Self-pay

## 2019-01-25 ENCOUNTER — Encounter (INDEPENDENT_AMBULATORY_CARE_PROVIDER_SITE_OTHER): Payer: Self-pay

## 2019-02-16 ENCOUNTER — Encounter (INDEPENDENT_AMBULATORY_CARE_PROVIDER_SITE_OTHER): Payer: Self-pay

## 2019-02-17 ENCOUNTER — Other Ambulatory Visit: Payer: Self-pay

## 2019-02-17 ENCOUNTER — Encounter (HOSPITAL_COMMUNITY): Payer: Self-pay

## 2019-02-17 ENCOUNTER — Ambulatory Visit (HOSPITAL_COMMUNITY)
Admission: EM | Admit: 2019-02-17 | Discharge: 2019-02-17 | Disposition: A | Discharge status: Home / Self Care | Payer: Medicaid Other | Attending: Family Medicine | Admitting: Family Medicine

## 2019-02-17 DIAGNOSIS — R739 Hyperglycemia, unspecified: Secondary | ICD-10-CM

## 2019-02-17 DIAGNOSIS — Z9641 Presence of insulin pump (external) (internal): Secondary | ICD-10-CM | POA: Insufficient documentation

## 2019-02-17 DIAGNOSIS — E109 Type 1 diabetes mellitus without complications: Secondary | ICD-10-CM

## 2019-02-17 DIAGNOSIS — E669 Obesity, unspecified: Secondary | ICD-10-CM | POA: Insufficient documentation

## 2019-02-17 DIAGNOSIS — Z794 Long term (current) use of insulin: Secondary | ICD-10-CM | POA: Diagnosis not present

## 2019-02-17 DIAGNOSIS — Z1159 Encounter for screening for other viral diseases: Secondary | ICD-10-CM

## 2019-02-17 DIAGNOSIS — I10 Essential (primary) hypertension: Secondary | ICD-10-CM | POA: Insufficient documentation

## 2019-02-17 DIAGNOSIS — R519 Headache, unspecified: Secondary | ICD-10-CM

## 2019-02-17 DIAGNOSIS — R51 Headache: Secondary | ICD-10-CM | POA: Diagnosis not present

## 2019-02-17 DIAGNOSIS — E1065 Type 1 diabetes mellitus with hyperglycemia: Secondary | ICD-10-CM | POA: Diagnosis not present

## 2019-02-17 DIAGNOSIS — Z20828 Contact with and (suspected) exposure to other viral communicable diseases: Secondary | ICD-10-CM | POA: Diagnosis not present

## 2019-02-17 LAB — CBC WITH DIFFERENTIAL/PLATELET
Abs Immature Granulocytes: 0.03 10*3/uL (ref 0.00–0.07)
Basophils Absolute: 0.1 10*3/uL (ref 0.0–0.1)
Basophils Relative: 1 %
Eosinophils Absolute: 0.1 10*3/uL (ref 0.0–0.5)
Eosinophils Relative: 1 %
HCT: 39 % (ref 36.0–46.0)
Hemoglobin: 12.5 g/dL (ref 12.0–15.0)
Immature Granulocytes: 0 %
Lymphocytes Relative: 36 %
Lymphs Abs: 3.6 10*3/uL (ref 0.7–4.0)
MCH: 25.2 pg — ABNORMAL LOW (ref 26.0–34.0)
MCHC: 32.1 g/dL (ref 30.0–36.0)
MCV: 78.6 fL — ABNORMAL LOW (ref 80.0–100.0)
Monocytes Absolute: 0.4 10*3/uL (ref 0.1–1.0)
Monocytes Relative: 4 %
Neutro Abs: 5.9 10*3/uL (ref 1.7–7.7)
Neutrophils Relative %: 58 %
Platelets: 300 10*3/uL (ref 150–400)
RBC: 4.96 MIL/uL (ref 3.87–5.11)
RDW: 13.2 % (ref 11.5–15.5)
WBC: 10.1 10*3/uL (ref 4.0–10.5)
nRBC: 0 % (ref 0.0–0.2)

## 2019-02-17 LAB — COMPREHENSIVE METABOLIC PANEL
ALT: 21 U/L (ref 0–44)
AST: 19 U/L (ref 15–41)
Albumin: 3.8 g/dL (ref 3.5–5.0)
Alkaline Phosphatase: 63 U/L (ref 38–126)
Anion gap: 14 (ref 5–15)
BUN: 9 mg/dL (ref 6–20)
CO2: 21 mmol/L — ABNORMAL LOW (ref 22–32)
Calcium: 9.4 mg/dL (ref 8.9–10.3)
Chloride: 99 mmol/L (ref 98–111)
Creatinine, Ser: 0.79 mg/dL (ref 0.44–1.00)
GFR calc Af Amer: >60 mL/min (ref >60)
GFR calc non Af Amer: >60 mL/min (ref >60)
Glucose, Bld: 343 mg/dL — ABNORMAL HIGH (ref 70–99)
Potassium: 4.3 mmol/L (ref 3.5–5.1)
Sodium: 134 mmol/L — ABNORMAL LOW (ref 135–145)
Total Bilirubin: 0.6 mg/dL (ref 0.3–1.2)
Total Protein: 7.6 g/dL (ref 6.5–8.1)

## 2019-02-17 LAB — GLUCOSE, CAPILLARY: Glucose-Capillary: 325 mg/dL — ABNORMAL HIGH (ref 70–99)

## 2019-02-17 MED ORDER — KETOROLAC TROMETHAMINE 30 MG/ML IJ SOLN
INTRAMUSCULAR | Status: AC
  Filled 2019-02-17: qty 1

## 2019-02-17 MED ORDER — KETOROLAC TROMETHAMINE 30 MG/ML IJ SOLN
30.0000 mg | Freq: Once | INTRAMUSCULAR | Status: AC
  Administered 2019-02-17: 30 mg via INTRAMUSCULAR

## 2019-02-17 NOTE — Discharge Instructions (Addendum)
Treating you for headache here in clinic today. Testing you for COVID to rule this out. If your blood sugars continue to be elevated I would follow-up with your primary care or your endocrinologist For any severe or worsening symptoms you need to go to the ER.

## 2019-02-17 NOTE — ED Triage Notes (Signed)
Pt states she has had high blood sugars the last 3 days. Pt states she has been have migraines as well.

## 2019-02-18 ENCOUNTER — Encounter (HOSPITAL_COMMUNITY): Payer: Self-pay

## 2019-02-18 LAB — NOVEL CORONAVIRUS, NAA (HOSP ORDER, SEND-OUT TO REF LAB; TAT 18-24 HRS): SARS-CoV-2, NAA: NOT DETECTED

## 2019-02-18 NOTE — ED Provider Notes (Signed)
[facility]    CSN: [id number] Arrival date & time: 02/17/19  0827     History   Chief Complaint Chief Complaint  Patient presents with  . Migraine    HPI Brandy Parks is a 23 y.o. female.   Patient is a 23 year old female with past medical history of diabetes, hypertension, obesity.  She presents today with elevated blood sugars over the past 3 days.  She is also had associated migraine.  Patient is a type I diabetic and she has insulin pump.  Describes the headache is located behind the left eye, throbbing.  She has had some associated photophobia and nausea.  She has been taking Aleve without much relief of headache.  Denies any associated cough, chest congestion, fevers, chills, abdominal pain, vomiting, diarrhea.  Denies any dysuria, hematuria or urinary frequency.  Denies any vaginal symptoms. ROS per HPI      Past Medical History:  Diagnosis Date  . Acanthosis nigricans, acquired   . Diabetes mellitus   . Hypertension   . Obesity     Patient Active Problem List   Diagnosis Date Noted  . Hyperglycemia 04/13/2018  . Elevated hemoglobin A1c 04/13/2018  . 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 Essential hypertension, benign   . Type I diabetes mellitus, uncontrolled (HCC) 01/10/2015  . Skin abscess 12/06/2014  . Insulin pump titration 02/08/2014  . Acanthosis nigricans, acquired   . Obesity 10/30/2010    History reviewed. No pertinent surgical history.  OB History   No obstetric history on file.      Home Medications    Prior to Admission medications   Medication Sig Start Date End Date Taking? Authorizing Provider  ACCU-CHEK FASTCLIX LANCETS MISC 1 each by Does not apply route as directed. Check sugar 6 x daily 06/23/13   Dessa PhiBadik, Jennifer, MD  Cetirizine HCl 10 MG CAPS Take 1 capsule (10 mg total) by mouth daily for 10 days. 05/21/18 10/14/18  Wieters, Hallie C, PA-C  fluticasone (FLONASE) 50 MCG/ACT nasal  spray Place 1-2 sprays into both nostrils daily for 7 days. 05/21/18 10/14/18  Wieters, Hallie C, PA-C  glucagon 1 MG injection Use for Severe Hypoglycemia . Inject 1mg  intramuscularly if unresponsive, unable to swallow, unconscious and/or has seizure 04/02/16   Gretchen ShortBeasley, Spenser, NP  insulin aspart (NOVOLOG) 100 UNIT/ML injection INJECT 300 UNITS INSULIN PUMP EVERY 48 HOURS 06/24/18   Gretchen ShortBeasley, Spenser, NP  lisinopril (PRINIVIL,ZESTRIL) 2.5 MG tablet TAKE 1 TABLET BY MOUTH ONCE DAILY 02/03/18   Gretchen ShortBeasley, Spenser, NP    Family History Family History  Problem Relation Age of Onset  . Obesity Mother   . Hypothyroidism Mother   . Hypertension Mother   . Obesity Maternal Aunt   . Hypertension Maternal Aunt   . Obesity Maternal Grandmother   . Hypertension Maternal Grandmother   . Kidney disease Maternal Grandmother   . Heart disease Maternal Grandfather   . Hypertension Maternal Grandfather   . Cancer Paternal Grandfather   . Diabetes Maternal Uncle   . Healthy Father     Social History Social History   Tobacco Use  . Smoking status: Never Smoker  . Smokeless tobacco: Never Used  Substance Use Topics  . Alcohol use: No  . Drug use: No     Allergies   Patient has no known allergies.   Review of Systems Review of Systems   Physical Exam Triage Vital Signs ED Triage Vitals  Enc Vitals Group     BP  02/17/19 0855 (!) 142/83     Pulse Rate 02/17/19 0855 100     Resp 02/17/19 0855 18     Temp 02/17/19 0855 98.6 F (37 C)     Temp Source 02/17/19 0855 Oral     SpO2 02/17/19 0855 98 %     Weight 02/17/19 0854 184 lb (83.5 kg)     Height --      Head Circumference --      Peak Flow --      Pain Score 02/17/19 0854 6     Pain Loc --      Pain Edu? --      Excl. in GC? --    No data found.  Updated Vital Signs BP (!) 142/83 (BP Location: Right Arm)   Pulse 100   Temp 98.6 F (37 C) (Oral)   Resp 18   Wt 184 lb (83.5 kg)   LMP 01/25/2019   SpO2 98%   BMI 38.21 kg/m    Visual Acuity Right Eye Distance:   Left Eye Distance:   Bilateral Distance:    Right Eye Near:   Left Eye Near:    Bilateral Near:     Physical Exam Vitals signs and nursing note reviewed.  Constitutional:      General: She is not in acute distress.    Appearance: Normal appearance. She is not ill-appearing, toxic-appearing or diaphoretic.  HENT:     Head: Normocephalic and atraumatic.     Nose: Nose normal.     Mouth/Throat:     Pharynx: Oropharynx is clear.  Eyes:     Conjunctiva/sclera: Conjunctivae normal.  Neck:     Musculoskeletal: Normal range of motion.  Cardiovascular:     Rate and Rhythm: Normal rate and regular rhythm.  Pulmonary:     Effort: Pulmonary effort is normal.     Breath sounds: Normal breath sounds.  Musculoskeletal: Normal range of motion.  Skin:    General: Skin is warm and dry.  Neurological:     General: No focal deficit present.     Mental Status: She is alert.  Psychiatric:        Mood and Affect: Mood normal.      UC Treatments / Results  Labs (all labs ordered are listed, but only abnormal results are displayed) Labs Reviewed  GLUCOSE, CAPILLARY - Abnormal; Notable for the following components:      Result Value   Glucose-Capillary 325 (*)    All other components within normal limits  CBC WITH DIFFERENTIAL/PLATELET - Abnormal; Notable for the following components:   MCV 78.6 (*)    MCH 25.2 (*)    All other components within normal limits  COMPREHENSIVE METABOLIC PANEL - Abnormal; Notable for the following components:   Sodium 134 (*)    CO2 21 (*)    Glucose, Bld 343 (*)    All other components within normal limits  NOVEL CORONAVIRUS, NAA (HOSPITAL ORDER, SEND-OUT TO REF LAB)  CBG MONITORING, ED    EKG   Radiology No results found.  Procedures Procedures (including critical care time)  Medications Ordered in UC Medications  ketorolac (TORADOL) 30 MG/ML injection 30 mg (30 mg Intramuscular Given 02/17/19 0940)   ketorolac (TORADOL) 30 MG/ML injection (has no administration in time range)    Initial Impression / Assessment and Plan / UC Course  I have reviewed the triage vital signs and the nursing notes.  Pertinent labs & imaging results that were available during my  care of the patient were reviewed by me and considered in my medical decision making (see chart for details).     Pt is a 23 year old type 1 diabetic. She has been having elevated blood sugars and headache. No other concerning signs or symptoms. Blood sugar 325 today. Treating headache with Toradol here in clinic. Blood work mostly normal besides elevated blood sugar. Testing for COVID to rule this out Recommended follow-up with PCP or endocrinologist for elevated blood sugars Final Clinical Impressions(s) / UC Diagnoses   Final diagnoses:  Nonintractable headache, unspecified chronicity pattern, unspecified headache type  Hyperglycemia     Discharge Instructions     Treating you for headache here in clinic today. Testing you for COVID to rule this out. If your blood sugars continue to be elevated I would follow-up with your primary care or your endocrinologist For any severe or worsening symptoms you need to go to the ER.    ED Prescriptions    None     Controlled Substance Prescriptions [place] Controlled Substance Registry consulted? Not Applicable   Orvan July, NP 02/18/19 [phone]

## 2019-03-22 ENCOUNTER — Other Ambulatory Visit: Payer: Self-pay | Admitting: Family

## 2019-04-03 ENCOUNTER — Other Ambulatory Visit: Payer: Self-pay

## 2019-04-03 ENCOUNTER — Ambulatory Visit (HOSPITAL_COMMUNITY)
Admission: EM | Admit: 2019-04-03 | Discharge: 2019-04-03 | Disposition: A | Discharge status: Home / Self Care | Payer: Medicaid Other

## 2019-04-03 ENCOUNTER — Encounter (HOSPITAL_COMMUNITY): Payer: Self-pay | Admitting: Emergency Medicine

## 2019-04-03 DIAGNOSIS — M545 Low back pain, unspecified: Secondary | ICD-10-CM

## 2019-04-03 MED ORDER — CYCLOBENZAPRINE HCL 10 MG PO TABS: 10.0000 mg | ORAL_TABLET | Freq: Two times a day (BID) | ORAL | 0 refills | Status: DC | PRN

## 2019-04-03 MED ORDER — IBUPROFEN 800 MG PO TABS: 800.0000 mg | ORAL_TABLET | Freq: Three times a day (TID) | ORAL | 0 refills | Status: DC | PRN

## 2019-04-03 NOTE — ED Provider Notes (Signed)
[facility]    CSN: [id number] Arrival date & time: 04/03/19  [place]      History   Chief Complaint Chief Complaint  Patient presents with  . Back Pain    HPI Brandy Parks is a 23 y.o. female.   Patient presents with right lower back pain x2 days.  She states it feels like a strained muscle.  She has treated this at home with left over cyclobenzaprine from a previous injury and states this improved her pain.  She denies saddle anesthesia, bowel/bladder incontinence, numbness, weakness, paresthesias, dysuria, fever, chills, abdominal pain, or other symptoms.  LMP: 03/28/2019.   The history is provided by the patient.    Past Medical History:  Diagnosis Date  . Acanthosis nigricans, acquired   . Diabetes mellitus   . Hypertension   . Obesity     Patient Active Problem List   Diagnosis Date Noted  . Hyperglycemia 04/13/2018  . Elevated hemoglobin A1c 04/13/2018  . 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 Essential hypertension, benign   . Type I diabetes mellitus, uncontrolled ([place]) 01/10/2015  . Skin abscess 12/06/2014  . Insulin pump titration 02/08/2014  . Acanthosis nigricans, acquired   . Obesity 10/30/2010    History reviewed. No pertinent surgical history.  OB History   No obstetric history on file.      Home Medications    Prior to Admission medications   Medication Sig Start Date End Date Taking? Authorizing Provider  cetirizine (ZYRTEC) 10 MG tablet Take 10 mg by mouth daily.   Yes [provider]  insulin aspart (NOVOLOG) 100 UNIT/ML injection INJECT 300 UNITS INSULIN PUMP EVERY 48 HOURS 06/24/18  Yes Hermenia Bers, NP  lisinopril (ZESTRIL) 2.5 MG tablet TAKE 1 TABLET BY MOUTH ONCE DAILY 03/22/19  Yes Hermenia Bers, NP  ACCU-CHEK FASTCLIX LANCETS MISC 1 each by Does not apply route as directed. Check sugar 6 x daily 06/23/13   Lelon Huh, MD  Cetirizine HCl 10 MG CAPS Take 1 capsule (10 mg  total) by mouth daily for 10 days. 05/21/18 10/14/18  Wieters, Hallie C, PA-C  cyclobenzaprine (FLEXERIL) 10 MG tablet Take 1 tablet (10 mg total) by mouth 2 (two) times daily as needed for muscle spasms. 04/03/19   Sharion Balloon, NP  fluticasone (FLONASE) 50 MCG/ACT nasal spray Place 1-2 sprays into both nostrils daily for 7 days. 05/21/18 10/14/18  Wieters, Hallie C, PA-C  glucagon 1 MG injection Use for Severe Hypoglycemia . Inject 1mg  intramuscularly if unresponsive, unable to swallow, unconscious and/or has seizure 04/02/16   Hermenia Bers, NP  ibuprofen (ADVIL) 800 MG tablet Take 1 tablet (800 mg total) by mouth every 8 (eight) hours as needed. 04/03/19   Sharion Balloon, NP    Family History Family History  Problem Relation Age of Onset  . Obesity Mother   . Hypothyroidism Mother   . Hypertension Mother   . Obesity Maternal Aunt   . Hypertension Maternal Aunt   . Obesity Maternal Grandmother   . Hypertension Maternal Grandmother   . Kidney disease Maternal Grandmother   . Heart disease Maternal Grandfather   . Hypertension Maternal Grandfather   . Cancer Paternal Grandfather   . Diabetes Maternal Uncle   . Healthy Father     Social History Social History   Tobacco Use  . Smoking status: Never Smoker  . Smokeless tobacco: Never Used  Substance Use Topics  . Alcohol use: No  . Drug use: No  Allergies   Patient has no known allergies.   Review of Systems Review of Systems  Constitutional: Negative for chills and fever.  HENT: Negative for ear pain and sore throat.   Eyes: Negative for pain and visual disturbance.  Respiratory: Negative for cough and shortness of breath.   Cardiovascular: Negative for chest pain and palpitations.  Gastrointestinal: Negative for abdominal pain and vomiting.  Genitourinary: Negative for dysuria and hematuria.  Musculoskeletal: Positive for back pain. Negative for arthralgias.  Skin: Negative for color change and rash.  Neurological:  Negative for seizures, syncope, weakness and numbness.  All other systems reviewed and are negative.    Physical Exam Triage Vital Signs ED Triage Vitals [04/03/19 1737]  Enc Vitals Group     BP 129/86     Pulse Rate (!) 110     Resp 18     Temp 98.6 F (37 C)     Temp Source Oral     SpO2 100 %     Weight      Height      Head Circumference      Peak Flow      Pain Score      Pain Loc      Pain Edu?      Excl. in GC?    No data found.  Updated Vital Signs BP 129/86 (BP Location: Right Arm)   Pulse (!) 110   Temp 98.6 F (37 C) (Oral)   Resp 18   LMP 03/28/2019   SpO2 100%   Visual Acuity Right Eye Distance:   Left Eye Distance:   Bilateral Distance:    Right Eye Near:   Left Eye Near:    Bilateral Near:     Physical Exam Vitals signs and nursing note reviewed.  Constitutional:      General: She is not in acute distress.    Appearance: She is well-developed.  HENT:     Head: Normocephalic and atraumatic.  Eyes:     Conjunctiva/sclera: Conjunctivae normal.  Neck:     Musculoskeletal: Neck supple.  Cardiovascular:     Rate and Rhythm: Normal rate and regular rhythm.     Heart sounds: No murmur.  Pulmonary:     Effort: Pulmonary effort is normal. No respiratory distress.     Breath sounds: Normal breath sounds.  Abdominal:     Palpations: Abdomen is soft.     Tenderness: There is no abdominal tenderness. There is no right CVA tenderness, left CVA tenderness, guarding or rebound.  Musculoskeletal: Normal range of motion.        General: No swelling, tenderness or deformity.  Skin:    General: Skin is warm and dry.     Capillary Refill: Capillary refill takes less than 2 seconds.     Findings: No bruising, erythema, lesion or rash.  Neurological:     General: No focal deficit present.     Mental Status: She is alert and oriented to person, place, and time.     Sensory: No sensory deficit.     Motor: No weakness.     Gait: Gait normal.       UC Treatments / Results  Labs (all labs ordered are listed, but only abnormal results are displayed) Labs Reviewed - No data to display  EKG   Radiology No results found.  Procedures Procedures (including critical care time)  Medications Ordered in UC Medications - No data to display  Initial Impression / Assessment and Plan /  UC Course  I have reviewed the triage vital signs and the nursing notes.  Pertinent labs & imaging results that were available during my care of the patient were reviewed by me and considered in my medical decision making (see chart for details).     Right lower back pain.  Treating with ibuprofen and Flexeril.  Precautions for drowsiness with Flexeril discussed with patient.  Instructed her to return here or follow-up with her PCP if her pain is not improving or she develops new symptoms such as dysuria, fever, chills, weakness, numbness, bowel/bladder incontinence, or other concerns.  Patient agrees plan of care.     Final Clinical Impressions(s) / UC Diagnoses   Final diagnoses:  Acute right-sided low back pain without sciatica     Discharge Instructions     Take the prescribed ibuprofen as needed for your pain.  Take the muscle relaxer Flexeril as needed for muscle spasm; do not drive, operate machinery, or drink alcohol with this medication as it may make you drowsy.    Return here or follow up with your primary care provider if your pain is not improving or gets worse; or if you develop new symptoms such as difficulty with urination, weakness, numbness, loss of control of your bladder or bowels, fever, or chills.         ED Prescriptions    Medication Sig Dispense Auth. Provider   ibuprofen (ADVIL) 800 MG tablet Take 1 tablet (800 mg total) by mouth every 8 (eight) hours as needed. 21 tablet Camille Thau H, NP   Mickie Bailcyclobenzaprine (FLEXERIL) 10 MG tablet Take 1 tablet (10 mg total) by mouth 2 (two) times daily as needed for muscle spasms. 20  tablet Mickie Bailate, Gaetano Romberger H, NP     PDMP not reviewed this encounter.   Mickie Bailate, Rosco Harriott H, NP 04/03/19 1800

## 2019-04-03 NOTE — Discharge Instructions (Signed)
Take the prescribed ibuprofen as needed for your pain.  Take the muscle relaxer Flexeril as needed for muscle spasm; do not drive, operate machinery, or drink alcohol with this medication as it may make you drowsy.   ° °Return here or follow up with your primary care provider if your pain is not improving or gets worse; or if you develop new symptoms such as difficulty with urination, weakness, numbness, loss of control of your bladder or bowels, fever, or chills.   ° °

## 2019-04-03 NOTE — ED Triage Notes (Signed)
Woke Friday morning with lower back pain.  No leg pain.  Denies burning with urination.  No known injury.    Patient did bowl last week, first time in a while

## 2019-04-20 ENCOUNTER — Telehealth (INDEPENDENT_AMBULATORY_CARE_PROVIDER_SITE_OTHER): Payer: Self-pay | Admitting: Radiology

## 2019-04-20 NOTE — Telephone Encounter (Signed)
  Who's calling (name and relationship to patient) : Kaeleigh Gilles    Best contact number: [phone]  Provider they see: Hermenia Bers    Reason for call: Patient called to speak with [name] directly about her Dexcom. Please call patient as soon as able.     PRESCRIPTION REFILL ONLY  Name of prescription:  Pharmacy:

## 2019-04-20 NOTE — Telephone Encounter (Signed)
Returned TC to [employer], patient said has been having trouble getting through [place] to request Dexcom sensors. Advised that she can pick them up at the local pharmacy. Advised will send the request for the approval. Also scheduled a f/up with provider next Thursday.

## 2019-04-28 ENCOUNTER — Ambulatory Visit (INDEPENDENT_AMBULATORY_CARE_PROVIDER_SITE_OTHER): Payer: Medicaid Other | Admitting: Family

## 2019-04-28 ENCOUNTER — Other Ambulatory Visit: Payer: Self-pay

## 2019-04-28 ENCOUNTER — Encounter (INDEPENDENT_AMBULATORY_CARE_PROVIDER_SITE_OTHER): Payer: Self-pay | Admitting: Family

## 2019-04-28 VITALS — BP 120/76 | HR 72 | Ht <= 58 in | Wt 188.4 lb

## 2019-04-28 DIAGNOSIS — E1065 Type 1 diabetes mellitus with hyperglycemia: Secondary | ICD-10-CM

## 2019-04-28 DIAGNOSIS — R7309 Other abnormal glucose: Secondary | ICD-10-CM

## 2019-04-28 DIAGNOSIS — R739 Hyperglycemia, unspecified: Secondary | ICD-10-CM | POA: Diagnosis not present

## 2019-04-28 DIAGNOSIS — Z4681 Encounter for fitting and adjustment of insulin pump: Secondary | ICD-10-CM

## 2019-04-28 DIAGNOSIS — E108 Type 1 diabetes mellitus with unspecified complications: Secondary | ICD-10-CM

## 2019-04-28 DIAGNOSIS — I10 Essential (primary) hypertension: Secondary | ICD-10-CM | POA: Diagnosis not present

## 2019-04-28 DIAGNOSIS — IMO0002 Reserved for concepts with insufficient information to code with codable children: Secondary | ICD-10-CM

## 2019-04-28 DIAGNOSIS — L83 Acanthosis nigricans: Secondary | ICD-10-CM

## 2019-04-28 LAB — POCT GLYCOSYLATED HEMOGLOBIN (HGB A1C): Hemoglobin A1C: 8.3 % — AB (ref 4.0–5.6)

## 2019-04-28 LAB — POCT GLUCOSE (DEVICE FOR HOME USE): POC Glucose: 176 mg/dl — AB (ref 70–99)

## 2019-04-28 MED ORDER — GLUCAGON (RDNA) 1 MG IJ KIT: PACK | INTRAMUSCULAR | 2 refills | Status: AC

## 2019-04-28 MED ORDER — DEXCOM G6 TRANSMITTER MISC: 1.0000 | 1 refills | Status: DC

## 2019-04-28 MED ORDER — DEXCOM G6 SENSOR MISC: 1.0000 | 1 refills | Status: DC

## 2019-04-28 NOTE — Progress Notes (Signed)
Pediatric Endocrinology Diabetes Consultation Follow-up Visit  Chief Complaint: Follow-up type 1 diabetes  Patient, No Pcp Per   HPI: Brandy Parks  is a 23 y.o. female presenting for follow-up of type 1 diabetes.  She is accompanied to this visit by her mother.  67. Brandy Parks was admitted to the [facility] on 10/01/10 with new onset DM. She had hyperglycemia to 491 but not DKA. Her HbA1c was 12.8% and her C-peptide was 0.64 (normal 0.8-3.0). Staria was short, quite obese, and had acanthosis nigricans c/w T2DM. However, her insulin requirement was more c/w T1DM and her GAD was positive at 13.5 and her Pancreatic Islet Cell Ab was positive at >80. She was started on MDI with Lantus and Novolog. She was transitioned to Medtronic 530G insulin pump on 12/26/13.  2. Since last visit to [facility] on 10/2018, she has been well. 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 She has been going for nightly walks almost every night to increase her activity. She is wearing Tslim insulin pump and Dexcom CGM. She was having problems getting Dexcom supplies but has since fixed the problem.   She has not updated control IQ with her insulin pump yet. She feels like her blood sugars could be better and she wants to improve them. She is rotating pump sites to her legs, buttocks and abdomen. She reports having hypoglycemia in the mornings.   She is taking 2.5 mg of Lisinopril per day.     Insulin regimen: Basal Rates 12AM 1.60  4AM 1.625  8AM 1.80  530pm 1.95  9pm 1.90    Insulin to Carbohydrate Ratio 12AM 15  6AM 9  8:30AM 9          Insulin Sensitivity Factor 12AM 50  8AM  35  10AM 35  10pm 35      Target Blood Glucose 12AM 150  6AM 120  8AM 150         Hypoglycemia: Able to feel low blood sugars.  No glucagon needed recently.  Insulin pump and CGM download:   - Avg Bg 178  -  Target range; In target 52%, above target 45% and below target 3%   - Using 64 units per day   - 59% basal and 41% bolus  Med-alert ID: Not currently wearing. Pump sites: Using abdomen and buttocks.  Changing sites every 3 days Annual labs due: 04/2019--> ordered  Ophthalmology:  2018 . DIscussed that she is due for eye exam.    3. ROS: Greater than 10 systems reviewed with pertinent positives listed in HPI, otherwise neg. General: Sleeping well> Good energy.  Eyes: Denies vision changes. No blurry vision.  Respiratory: No SOB. No trouble breathing.  Cardiac: Denies tachycardia, palpitations, chest pain.  Genitourinary: No polyuria or polydipsia.  Periods are monthly Psychiatric: Normal affect. Denies anxiety and depression.  Skin: No rash, or skin breakdown.  Neuro: denies tingling of feet currently, no pain Endo: denies polyuria, polydipsia.   Past Medical History:   Past Medical History:  Diagnosis Date  . Acanthosis nigricans, acquired   . Diabetes mellitus   . Hypertension   . Obesity     Current Outpatient Medications on File Prior to Visit  Medication Sig Dispense Refill  . ACCU-CHEK FASTCLIX LANCETS MISC 1 each by Does not apply route as directed. Check sugar 6 x daily 204 each 6  . cetirizine (  ZYRTEC) 10 MG tablet Take 10 mg by mouth daily.    Marland Kitchen glucagon 1 MG injection Use for Severe Hypoglycemia . Inject '1mg'$  intramuscularly if unresponsive, unable to swallow, unconscious and/or has seizure 2 kit 2  . ibuprofen (ADVIL) 800 MG tablet Take 1 tablet (800 mg total) by mouth every 8 (eight) hours as needed. 21 tablet 0  . insulin aspart (NOVOLOG) 100 UNIT/ML injection INJECT 300 UNITS INSULIN PUMP EVERY 48 HOURS 50 mL 6  . lisinopril (ZESTRIL) 2.5 MG tablet TAKE 1 TABLET BY MOUTH ONCE DAILY 30 tablet 5  . Cetirizine HCl 10 MG CAPS Take 1 capsule (10 mg total) by mouth daily for 10 days. 10 capsule 0  . cyclobenzaprine (FLEXERIL) 10 MG tablet Take 1 tablet (10 mg total) by  mouth 2 (two) times daily as needed for muscle spasms. (Patient not taking: Reported on 04/28/2019) 20 tablet 0  . fluticasone (FLONASE) 50 MCG/ACT nasal spray Place 1-2 sprays into both nostrils daily for 7 days. 1 g 0  . VIENVA 0.1-20 MG-MCG tablet TK 1 T PO QD     No current facility-administered medications on file prior to visit.     No Known Allergies  Surgical History: No past surgical history on file.  Family History:  Family History  Problem Relation Age of Onset  . Obesity Mother   . Hypothyroidism Mother   . Hypertension Mother   . Obesity Maternal Aunt   . Hypertension Maternal Aunt   . Obesity Maternal Grandmother   . Hypertension Maternal Grandmother   . Kidney disease Maternal Grandmother   . Heart disease Maternal Grandfather   . Hypertension Maternal Grandfather   . Cancer Paternal Grandfather   . Diabetes Maternal Uncle   . Healthy Father      Social History: Lives with: maternal grandparents, mother Online classes. [place].   Physical Exam:  Vitals:   04/28/19 1109  BP: 120/76  Pulse: 72  Weight: 188 lb 6.4 oz (85.5 kg)  Height: 4' 9.68" (1.465 m)   BP 120/76   Pulse 72   Ht 4' 9.68" (1.465 m)   Wt 188 lb 6.4 oz (85.5 kg)   LMP 04/21/2019 (Approximate)   BMI 39.82 kg/m  Body mass index: body mass index is 39.82 kg/m. Growth percentile SmartLinks can only be used for patients less than 78 years old.  PHYSICAL EXAM General: Well developed, well nourished female in no acute distress.  Alert and oriented.  Head: Normocephalic, atraumatic.   Eyes:  Pupils equal and round. EOMI.   Sclera white.  No eye drainage.   Ears/Nose/Mouth/Throat: Nares patent, no nasal drainage.  Normal dentition, mucous membranes moist.   Neck: supple, no cervical lymphadenopathy, no thyromegaly Cardiovascular: regular rate, normal S1/S2, no murmurs Respiratory: No increased work of breathing.  Lungs clear to auscultation bilaterally.  No wheezes. Abdomen: soft,  nontender, nondistended. Normal bowel sounds.  No appreciable masses  Extremities: warm, well perfused, cap refill < 2 sec.   Musculoskeletal: Normal muscle mass.  Normal strength Skin: warm, dry.  No rash or lesions. + acanthosis nigricans.  Neurologic: alert and oriented, normal speech, no tremor   Labs:   - Last a1c 07/2018: 7.4%    Results for orders placed or performed in visit on 04/28/19  POCT Glucose (Device for Home Use)  Result Value Ref Range   Glucose Fasting, POC     POC Glucose 176 (A) 70 - 99 mg/dl  POCT glycosylated hemoglobin (Hb A1C)  Result  Value Ref Range   Hemoglobin A1C 8.3 (A) 4.0 - 5.6 %   HbA1c POC (<> result, manual entry)     HbA1c, POC (prediabetic range)     HbA1c, POC (controlled diabetic range)        Assessment/Plan: Annalysa is a 23 y.o. female with type 1 diabetes in sub optimal control on insulin pump therapy. She is having a pattern of hypoglycemia between 4 and 6 am, will reduce basal rate. She is also frequently having post prandial hyperglycemia, needs stronger carb ratio at meals. Hemoglobin A1c is 8.3% which is higher then ADA goal of <7%. She needs to update pump to control IQ.   1. Type I diabetes mellitus, uncontrolled/ 2. Hyperglycemia/ 3. Elevated A1c  - Reviewed insulin pump and CGM download. Discussed trends and patterns.  - Rotate pump sites to prevent scar tissue.  - bolus 15 minutes prior to eating to limit blood sugar spikes.  - Reviewed carb counting and importance of accurate carb counting.  - Discussed signs and symptoms of hypoglycemia. Always have glucose available.  - POCT glucose and hemoglobin A1c  - Reviewed growth chart.  - Labs: Lipid pane, TFTs and microalbumin ordered.  - Discussed updating pump to control IQ and benefits.   4. Insulin pump in place   Basal Rates 12AM 1.60  4AM--> 3am 1.625--> 1.575  8AM 1.80  530pm 1.95  9pm 1.90    Insulin to Carbohydrate Ratio 12AM 15  6AM 9  8:30AM 9--> 8   5pm  9--> 8.5       5. Hypertension - 2.5 mg of lisinopril per day.   I have spent >25  minutes with >50% of time in counseling, education and instruction. When a patient is on insulin, intensive monitoring of blood glucose levels is necessary to avoid hyperglycemia and hypoglycemia. Severe hyperglycemia/hypoglycemia can lead to hospital admissions and be life threatening.    Follow-up:   2 months.     Hermenia Bers,  FNP-C  Pediatric Specialist  [address] [place]  [place], [address]  Tele: [phone]

## 2019-04-28 NOTE — Patient Instructions (Signed)
-  Always have fast sugar with you in case of low blood sugar (glucose tabs, regular juice or soda, candy) -Always wear your ID that states you have diabetes -Always bring your meter to your visit -Call/Email if you want to review blood sugars   

## 2019-06-16 ENCOUNTER — Encounter (INDEPENDENT_AMBULATORY_CARE_PROVIDER_SITE_OTHER): Payer: Self-pay

## 2019-07-12 ENCOUNTER — Other Ambulatory Visit (INDEPENDENT_AMBULATORY_CARE_PROVIDER_SITE_OTHER): Payer: Self-pay | Admitting: Family

## 2019-07-18 ENCOUNTER — Encounter (INDEPENDENT_AMBULATORY_CARE_PROVIDER_SITE_OTHER): Payer: Self-pay

## 2019-07-19 ENCOUNTER — Encounter (INDEPENDENT_AMBULATORY_CARE_PROVIDER_SITE_OTHER): Payer: Self-pay

## 2019-07-20 ENCOUNTER — Other Ambulatory Visit: Payer: Medicaid Other

## 2019-07-29 ENCOUNTER — Ambulatory Visit (INDEPENDENT_AMBULATORY_CARE_PROVIDER_SITE_OTHER): Payer: Medicaid Other | Admitting: Family

## 2019-08-09 DIAGNOSIS — L83 Acanthosis nigricans: Secondary | ICD-10-CM | POA: Insufficient documentation

## 2019-08-26 ENCOUNTER — Encounter (INDEPENDENT_AMBULATORY_CARE_PROVIDER_SITE_OTHER): Payer: Self-pay

## 2019-08-26 ENCOUNTER — Ambulatory Visit (INDEPENDENT_AMBULATORY_CARE_PROVIDER_SITE_OTHER): Payer: Medicaid Other | Admitting: Family

## 2019-08-26 NOTE — Progress Notes (Deleted)
Pediatric Endocrinology Diabetes Consultation Follow-up Visit  Chief Complaint: Follow-up type 1 diabetes  Patient, No Pcp Per   HPI: Brandy Parks  is a 24 y.o. female presenting for follow-up of type 1 diabetes.  She is accompanied to this visit by her mother.  69. Brandy Parks was admitted to the [facility] on 10/01/10 with new onset DM. She had hyperglycemia to 491 but not DKA. Her HbA1c was 12.8% and her C-peptide was 0.64 (normal 0.8-3.0). Brandy Parks was short, quite obese, and had acanthosis nigricans c/w T2DM. However, her insulin requirement was more c/w T1DM and her GAD was positive at 13.5 and her Pancreatic Islet Cell Ab was positive at >80. She was started on MDI with Lantus and Novolog. She was transitioned to Medtronic 530G insulin pump on 12/26/13.  2. Since last visit to [facility] on 04/2019, she has been well. 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 She has been going for nightly walks almost every night to increase her activity. She is wearing Tslim insulin pump and Dexcom CGM. She was having problems getting Dexcom supplies but has since fixed the problem.   She has not updated control IQ with her insulin pump yet. She feels like her blood sugars could be better and she wants to improve them. She is rotating pump sites to her legs, buttocks and abdomen. She reports having hypoglycemia in the mornings.   She is taking 2.5 mg of Lisinopril per day.     Insulin regimen: Basal Rates 12AM 1.60  4AM 1.575  8AM 1.80  530pm 1.95  9pm 1.90    Insulin to Carbohydrate Ratio 12AM 15  6AM 8  8:30AM 8.5          Insulin Sensitivity Factor 12AM 50  8AM  35  10AM 35  10pm 35      Target Blood Glucose 12AM 150  6AM 120  8AM 150         Hypoglycemia: Able to feel low blood sugars.  No glucagon needed recently.  Insulin pump and CGM download:   - Avg Bg 178  -  Target range; In target 52%, above target 45% and below target 3%   - Using 64 units per day   - 59% basal and 41% bolus  Med-alert ID: Not currently wearing. Pump sites: Using abdomen and buttocks.  Changing sites every 3 days Annual labs due: 04/2020 Ophthalmology:  2018 . DIscussed that she is due for eye exam.    3. ROS: Greater than 10 systems reviewed with pertinent positives listed in HPI, otherwise neg. General: Sleeping well> Good energy.  Eyes: Denies vision changes. No blurry vision.  Respiratory: No SOB. No trouble breathing.  Cardiac: Denies tachycardia, palpitations, chest pain.  Genitourinary: No polyuria or polydipsia.  Periods are monthly Psychiatric: Normal affect. Denies anxiety and depression.  Skin: No rash, or skin breakdown.  Neuro: denies tingling of feet currently, no pain Endo: denies polyuria, polydipsia.   Past Medical History:   Past Medical History:  Diagnosis Date  . Acanthosis nigricans, acquired   . Diabetes mellitus   . Hypertension   . Obesity     Current Outpatient Medications on File Prior to Visit  Medication Sig Dispense Refill  . ACCU-CHEK FASTCLIX LANCETS MISC 1 each by Does not apply route as directed. Check sugar 6 x daily 204 each 6  . cetirizine (ZYRTEC) 10  MG tablet Take 10 mg by mouth daily.    . Cetirizine HCl 10 MG CAPS Take 1 capsule (10 mg total) by mouth daily for 10 days. 10 capsule 0  . Continuous Blood Gluc Sensor (DEXCOM G6 SENSOR) MISC 1 kit by Does not apply route as directed. Change sensor every 10 days 9 each 1  . Continuous Blood Gluc Transmit (DEXCOM G6 TRANSMITTER) MISC 1 kit by Does not apply route every 3 (three) months. 1 each 1  . cyclobenzaprine (FLEXERIL) 10 MG tablet Take 1 tablet (10 mg total) by mouth 2 (two) times daily as needed for muscle spasms. (Patient not taking: Reported on 04/28/2019) 20 tablet 0  . fluticasone (FLONASE) 50 MCG/ACT nasal spray Place 1-2 sprays into both nostrils daily for 7 days. 1 g  0  . glucagon 1 MG injection Use for Severe Hypoglycemia . Inject '1mg'$  intramuscularly if unresponsive, unable to swallow, unconscious and/or has seizure 2 kit 2  . ibuprofen (ADVIL) 800 MG tablet Take 1 tablet (800 mg total) by mouth every 8 (eight) hours as needed. 21 tablet 0  . insulin aspart (NOVOLOG) 100 UNIT/ML injection INJECT 300 UNITS OF INSULIN IN PUMP EVERY 48 HOURS 50 mL 6  . lisinopril (ZESTRIL) 2.5 MG tablet TAKE 1 TABLET BY MOUTH ONCE DAILY 30 tablet 5  . VIENVA 0.1-20 MG-MCG tablet TK 1 T PO QD     No current facility-administered medications on file prior to visit.    No Known Allergies  Surgical History: No past surgical history on file.  Family History:  Family History  Problem Relation Age of Onset  . Obesity Mother   . Hypothyroidism Mother   . Hypertension Mother   . Obesity Maternal Aunt   . Hypertension Maternal Aunt   . Obesity Maternal Grandmother   . Hypertension Maternal Grandmother   . Kidney disease Maternal Grandmother   . Heart disease Maternal Grandfather   . Hypertension Maternal Grandfather   . Cancer Paternal Grandfather   . Diabetes Maternal Uncle   . Healthy Father      Social History: Lives with: maternal grandparents, mother Online classes. [place].   Physical Exam:  There were no vitals filed for this visit. There were no vitals taken for this visit. Body mass index: body mass index is unknown because there is no height or weight on file. Growth percentile SmartLinks can only be used for patients less than 48 years old.  PHYSICAL EXAM General: Well developed, well nourished female in no acute distress.  Alert and oriented.  Head: Normocephalic, atraumatic.   Eyes:  Pupils equal and round. EOMI.   Sclera white.  No eye drainage.   Ears/Nose/Mouth/Throat: Nares patent, no nasal drainage.  Normal dentition, mucous membranes moist.   Neck: supple, no cervical lymphadenopathy, no thyromegaly Cardiovascular: regular rate,  normal S1/S2, no murmurs Respiratory: No increased work of breathing.  Lungs clear to auscultation bilaterally.  No wheezes. Abdomen: soft, nontender, nondistended. Normal bowel sounds.  No appreciable masses  Extremities: warm, well perfused, cap refill < 2 sec.   Musculoskeletal: Normal muscle mass.  Normal strength Skin: warm, dry.  No rash or lesions. Neurologic: alert and oriented, normal speech, no tremor    Labs:   - Last a1c 10 /2020: 8.3%    Results for orders placed or performed in visit on 04/28/19  POCT Glucose (Device for Home Use)  Result Value Ref Range   Glucose Fasting, POC     POC Glucose 176 (A)  70 - 99 mg/dl  POCT glycosylated hemoglobin (Hb A1C)  Result Value Ref Range   Hemoglobin A1C 8.3 (A) 4.0 - 5.6 %   HbA1c POC (<> result, manual entry)     HbA1c, POC (prediabetic range)     HbA1c, POC (controlled diabetic range)        Assessment/Plan: Brandy Parks is a 24 y.o. female with type 1 diabetes in sub optimal control on insulin pump therapy. She is having a pattern of hypoglycemia between 4 and 6 am, will reduce basal rate. She is also frequently having post prandial hyperglycemia, needs stronger carb ratio at meals. Hemoglobin A1c is 8.3% which is higher then ADA goal of <7%. She needs to update pump to control IQ.   1. Type I diabetes mellitus, uncontrolled/ 2. Hyperglycemia/ 3. Elevated A1c  - Reviewed insulin pump and CGM download. Discussed trends and patterns.  - Rotate pump sites to prevent scar tissue.  - bolus 15 minutes prior to eating to limit blood sugar spikes.  - Reviewed carb counting and importance of accurate carb counting.  - Discussed signs and symptoms of hypoglycemia. Always have glucose available.  - POCT glucose and hemoglobin A1c  - Reviewed growth chart.   4. Insulin pump in place   Basal Rates 12AM 1.60  4AM--> 3am 1.625--> 1.575  8AM 1.80  530pm 1.95  9pm 1.90    Insulin to Carbohydrate Ratio 12AM 15  6AM 9  8:30AM 9-->  8   5pm 9--> 8.5       5. Hypertension - 2.5 mg of lisinopril per day.   I have spent >25  minutes with >50% of time in counseling, education and instruction. When a patient is on insulin, intensive monitoring of blood glucose levels is necessary to avoid hyperglycemia and hypoglycemia. Severe hyperglycemia/hypoglycemia can lead to hospital admissions and be life threatening.    Follow-up:   2 months.     Hermenia Bers,  FNP-C  Pediatric Specialist  [address] [place]  [place], [address]  Tele: [phone]

## 2019-09-07 ENCOUNTER — Encounter (INDEPENDENT_AMBULATORY_CARE_PROVIDER_SITE_OTHER): Payer: Self-pay

## 2019-09-07 ENCOUNTER — Other Ambulatory Visit (INDEPENDENT_AMBULATORY_CARE_PROVIDER_SITE_OTHER): Payer: Self-pay

## 2019-09-07 DIAGNOSIS — R739 Hyperglycemia, unspecified: Secondary | ICD-10-CM

## 2019-09-07 DIAGNOSIS — R7309 Other abnormal glucose: Secondary | ICD-10-CM

## 2019-09-07 DIAGNOSIS — E1065 Type 1 diabetes mellitus with hyperglycemia: Secondary | ICD-10-CM

## 2019-09-07 MED ORDER — DEXCOM G6 TRANSMITTER MISC: 1.0000 | 1 refills | Status: DC

## 2019-09-07 MED ORDER — DEXCOM G6 SENSOR MISC: 1.0000 | 1 refills | Status: DC

## 2019-09-16 ENCOUNTER — Ambulatory Visit (INDEPENDENT_AMBULATORY_CARE_PROVIDER_SITE_OTHER): Payer: Medicaid Other | Admitting: Family

## 2019-09-16 ENCOUNTER — Other Ambulatory Visit: Payer: Self-pay

## 2019-09-16 ENCOUNTER — Encounter (INDEPENDENT_AMBULATORY_CARE_PROVIDER_SITE_OTHER): Payer: Self-pay | Admitting: Family

## 2019-09-16 VITALS — BP 128/80 | HR 102 | Ht 58.27 in | Wt 188.0 lb

## 2019-09-16 DIAGNOSIS — L83 Acanthosis nigricans: Secondary | ICD-10-CM

## 2019-09-16 DIAGNOSIS — I10 Essential (primary) hypertension: Secondary | ICD-10-CM

## 2019-09-16 DIAGNOSIS — R7309 Other abnormal glucose: Secondary | ICD-10-CM

## 2019-09-16 DIAGNOSIS — Z4681 Encounter for fitting and adjustment of insulin pump: Secondary | ICD-10-CM

## 2019-09-16 DIAGNOSIS — E1065 Type 1 diabetes mellitus with hyperglycemia: Secondary | ICD-10-CM | POA: Diagnosis not present

## 2019-09-16 DIAGNOSIS — R739 Hyperglycemia, unspecified: Secondary | ICD-10-CM | POA: Diagnosis not present

## 2019-09-16 LAB — POCT GLYCOSYLATED HEMOGLOBIN (HGB A1C): Hemoglobin A1C: 8.3 % — AB (ref 4.0–5.6)

## 2019-09-16 LAB — POCT GLUCOSE (DEVICE FOR HOME USE): Glucose Fasting, POC: 264 mg/dL — AB (ref 70–99)

## 2019-09-16 NOTE — Progress Notes (Signed)
Pediatric Endocrinology Diabetes Consultation Follow-up Visit  Chief Complaint: Follow-up type 1 diabetes  Brandy Parks, [name], PA-C   HPI: Brandy Parks  is a 24 y.o. female presenting for follow-up of type 1 diabetes.  She is accompanied to this visit by her mother.  22. Brandy Parks was admitted to the [facility] on 10/01/10 with new onset DM. She had hyperglycemia to 491 but not DKA. Her HbA1c was 12.8% and her C-peptide was 0.64 (normal 0.8-3.0). Brandy Parks was short, quite obese, and had acanthosis nigricans c/w T2DM. However, her insulin requirement was more c/w T1DM and her GAD was positive at 13.5 and her Pancreatic Islet Cell Ab was positive at >80. She was started on MDI with Lantus and Novolog. She was transitioned to Medtronic 530G insulin pump on 12/26/13.  2. Since last visit to [facility] on 04/2019, she has been well. No hospitalizations  She is back in beauty school, it is going well. She is doing more walking at night to try to get exercise. She is using Tslim insulin pump and Dexcom CGM. She tried to upgrade it to control IQ but is having issuing.   Concerns:  Blood sugars rising around 2 am then start going back down.  Wants to get control IQ but having issues getting approval.  Higher overall despite bolusing before she eats.  Blood pressure medication was increased by her PCP     Insulin regimen: Basal Rates 12AM 1.60  4AM 1.570  8AM 1.80  530pm 1.90  9pm 1.90    Insulin to Carbohydrate Ratio 12AM 15  6AM 8  8:30AM 8.5          Insulin Sensitivity Factor 12AM 50  8AM  35  10AM 35  10pm 35      Target Blood Glucose 12AM 150  6AM 120  8AM 150         Hypoglycemia: Able to feel low blood sugars.  No glucagon needed recently.  Insulin pump and CGM download:   Med-alert ID: Not currently wearing. Pump sites: Using abdomen and buttocks.  Changing sites every 3 days Annual labs due: 04/2020 Ophthalmology:  2018 . DIscussed that she is due for eye  exam.    3. ROS: Greater than 10 systems reviewed with pertinent positives listed in HPI, otherwise neg. General: Sleeping well> Good energy.  Eyes: Denies vision changes. No blurry vision.  Respiratory: No SOB. No trouble breathing.  Cardiac: Denies tachycardia, palpitations, chest pain.  Genitourinary: No polyuria or polydipsia.  Periods are monthly Psychiatric: Normal affect. Denies anxiety and depression.  Skin: No rash, or skin breakdown.  Neuro: denies tingling of feet currently, no pain Endo: denies polyuria, polydipsia.   Past Medical History:   Past Medical History:  Diagnosis Date  . Acanthosis nigricans, acquired   . Diabetes mellitus   . Hypertension   . Obesity     Current Outpatient Medications on File Prior to Visit  Medication Sig Dispense Refill  . cetirizine (ZYRTEC) 10 MG tablet Take 10 mg by mouth daily.    . Continuous Blood Gluc Sensor (DEXCOM G6 SENSOR) MISC 1 kit by Does not apply route as directed. Change sensor every 10 days 9 each 1  . Continuous Blood Gluc Transmit (DEXCOM G6 TRANSMITTER) MISC 1 kit by Does not apply route every 3 (three) months. 1 each 1  . glucagon 1 MG injection Use for Severe Hypoglycemia . Inject '1mg'$  intramuscularly if unresponsive, unable to swallow, unconscious and/or has seizure 2 kit 2  .  ibuprofen (ADVIL) 800 MG tablet Take 1 tablet (800 mg total) by mouth every 8 (eight) hours as needed. 21 tablet 0  . insulin aspart (NOVOLOG) 100 UNIT/ML injection INJECT 300 UNITS OF INSULIN IN PUMP EVERY 48 HOURS 50 mL 6  . lisinopril (ZESTRIL) 2.5 MG tablet TAKE 1 TABLET BY MOUTH ONCE DAILY 30 tablet 5  . ACCU-CHEK FASTCLIX LANCETS MISC 1 each by Does not apply route as directed. Check sugar 6 x daily 204 each 6  . Cetirizine HCl 10 MG CAPS Take 1 capsule (10 mg total) by mouth daily for 10 days. 10 capsule 0  . cyclobenzaprine (FLEXERIL) 10 MG tablet Take 1 tablet (10 mg total) by mouth 2 (two) times daily as needed for muscle spasms.  (Patient not taking: Reported on 04/28/2019) 20 tablet 0  . fluticasone (FLONASE) 50 MCG/ACT nasal spray Place 1-2 sprays into both nostrils daily for 7 days. 1 g 0  . VIENVA 0.1-20 MG-MCG tablet TK 1 T PO QD     No current facility-administered medications on file prior to visit.    No Known Allergies  Surgical History: No past surgical history on file.  Family History:  Family History  Problem Relation Age of Onset  . Obesity Mother   . Hypothyroidism Mother   . Hypertension Mother   . Obesity Maternal Aunt   . Hypertension Maternal Aunt   . Obesity Maternal Grandmother   . Hypertension Maternal Grandmother   . Kidney disease Maternal Grandmother   . Heart disease Maternal Grandfather   . Hypertension Maternal Grandfather   . Cancer Paternal Grandfather   . Diabetes Maternal Uncle   . Healthy Father      Social History: Lives with: maternal grandparents, mother Online classes. Brandy Parks.   Physical Exam:  Vitals:   09/16/19 0833  BP: 128/80  Pulse: (!) 102  Weight: 188 lb (85.3 kg)  Height: 4' 10.27" (1.48 m)   BP 128/80   Pulse (!) 102   Ht 4' 10.27" (1.48 m)   Wt 188 lb (85.3 kg)   BMI 38.93 kg/m  Body mass index: body mass index is 38.93 kg/m. Growth percentile SmartLinks can only be used for patients less than 15 years old.  PHYSICAL EXAM General: Well developed, well nourished female in no acute distress.  Alert and oriented.  Head: Normocephalic, atraumatic.   Eyes:  Pupils equal and round. EOMI.   Sclera white.  No eye drainage.   Ears/Nose/Mouth/Throat: Nares patent, no nasal drainage.  Normal dentition, mucous membranes moist.   Neck: supple, no cervical lymphadenopathy, no thyromegaly Cardiovascular: regular rate, normal S1/S2, no murmurs Respiratory: No increased work of breathing.  Lungs clear to auscultation bilaterally.  No wheezes. Abdomen: soft, nontender, nondistended. Normal bowel sounds.  No appreciable masses  Extremities: warm,  well perfused, cap refill < 2 sec.   Musculoskeletal: Normal muscle mass.  Normal strength Skin: warm, dry.  No rash or lesions.+ acanthosis nigricans.  Neurologic: alert and oriented, normal speech, no tremor   Labs:   - Last a1c 10 /2020: 8.3%    Results for orders placed or performed in visit on 09/16/19  POCT glycosylated hemoglobin (Hb A1C)  Result Value Ref Range   Hemoglobin A1C 8.3 (A) 4.0 - 5.6 %   HbA1c POC (<> result, manual entry)     HbA1c, POC (prediabetic range)     HbA1c, POC (controlled diabetic range)    POCT Glucose (Device for Home Use)  Result Value Ref Range  Glucose Fasting, POC 264 (A) 70 - 99 mg/dL   POC Glucose        Assessment/Plan: Brandy Parks is a 24 y.o. female with type 1 diabetes in sub optimal control on insulin pump therapy. Having frequent hyperglycemia. Needs basal rates increased and stronger carb ratio. Will greatly benefit from control IQ. Hemoglobin A1c is 8.3%.   Which is higher then ADA goal. Blood pressure improving on 5 mg of Lisinopril per day.  1. Type I diabetes mellitus, uncontrolled/ 2. Hyperglycemia/ 3. Elevated A1c  - Reviewed insulin pump and CGM download. Discussed trends and patterns.  - Rotate pump sites to prevent scar tissue.  - bolus 15 minutes prior to eating to limit blood sugar spikes.  - Reviewed carb counting and importance of accurate carb counting.  - Discussed signs and symptoms of hypoglycemia. Always have glucose available.  - POCT glucose and hemoglobin A1c  - Reviewed growth chart.   - Discussed upgrade to control IQ.   4. Insulin pump in place  Basal Rates 12AM 1.60--> 1.68  4AM 1.570--> 1.62  8AM 1.80--> 1.85  530pm 1.90--> 1.95  9pm 1.90--> 1.95    Insulin to Carbohydrate Ratio 12AM 15  6AM 8--> 7  8 am  9--> 8,5   10 am  8--> 7.5   5pm 9pm 8.5--> 7.5  9--> 8    5. Hypertension - 5 mg of lisinopril per day.   >35 spent today reviewing the medical chart, counseling the patient/family, and  documenting today's visit.  When a patient is on insulin, intensive monitoring of blood glucose levels is necessary to avoid hyperglycemia and hypoglycemia. Severe hyperglycemia/hypoglycemia can lead to hospital admissions and be life threatening.    Follow-up:   2 months.     Hermenia Bers,  FNP-C  Pediatric Specialist  [address] [place]  [place], [address]  Tele: [phone]

## 2019-09-16 NOTE — Patient Instructions (Signed)
Basal Rates 12AM 1.60--> 1.68  4AM 1.570--> 1.62  8AM 1.80--> 1.85  530pm 1.90--> 1.95  9pm 1.90--> 1.95    Insulin to Carbohydrate Ratio 12AM 15  6AM 8--> 7  8 am  9--> 8,5   10 am  8--> 7.5   5pm 9pm 8.5--> 7.5  9--> 8

## 2019-09-19 ENCOUNTER — Encounter (INDEPENDENT_AMBULATORY_CARE_PROVIDER_SITE_OTHER): Payer: Self-pay

## 2019-10-25 ENCOUNTER — Encounter (INDEPENDENT_AMBULATORY_CARE_PROVIDER_SITE_OTHER): Payer: Self-pay

## 2019-10-28 ENCOUNTER — Encounter (INDEPENDENT_AMBULATORY_CARE_PROVIDER_SITE_OTHER): Payer: Self-pay

## 2019-12-12 ENCOUNTER — Ambulatory Visit (INDEPENDENT_AMBULATORY_CARE_PROVIDER_SITE_OTHER): Payer: Medicaid Other | Admitting: Family

## 2019-12-12 ENCOUNTER — Encounter (INDEPENDENT_AMBULATORY_CARE_PROVIDER_SITE_OTHER): Payer: Self-pay | Admitting: Family

## 2019-12-12 ENCOUNTER — Other Ambulatory Visit: Payer: Self-pay

## 2019-12-12 VITALS — BP 132/88 | HR 80 | Wt 190.2 lb

## 2019-12-12 DIAGNOSIS — I10 Essential (primary) hypertension: Secondary | ICD-10-CM

## 2019-12-12 DIAGNOSIS — Z9641 Presence of insulin pump (external) (internal): Secondary | ICD-10-CM

## 2019-12-12 DIAGNOSIS — R635 Abnormal weight gain: Secondary | ICD-10-CM

## 2019-12-12 DIAGNOSIS — R739 Hyperglycemia, unspecified: Secondary | ICD-10-CM | POA: Diagnosis not present

## 2019-12-12 DIAGNOSIS — R7309 Other abnormal glucose: Secondary | ICD-10-CM | POA: Diagnosis not present

## 2019-12-12 DIAGNOSIS — E1065 Type 1 diabetes mellitus with hyperglycemia: Secondary | ICD-10-CM | POA: Diagnosis not present

## 2019-12-12 LAB — POCT GLYCOSYLATED HEMOGLOBIN (HGB A1C): Hemoglobin A1C: 8.4 % — AB (ref 4.0–5.6)

## 2019-12-12 LAB — POCT GLUCOSE (DEVICE FOR HOME USE): POC Glucose: 160 mg/dl — AB (ref 70–99)

## 2019-12-12 NOTE — Patient Instructions (Signed)
- -  Always have fast sugar with you in case of low blood sugar (glucose tabs, regular juice or soda, candy) -Always wear your ID that states you have diabetes -Always bring your meter to your visit -Call/Email if you want to review blood sugars   - BOLUS BEFORE YOU EAT!!! Goal is 15 minutes before eating.  - Get Control IQ on insulin pump

## 2019-12-12 NOTE — Progress Notes (Signed)
Pediatric Endocrinology Diabetes Consultation Follow-up Visit  Chief Complaint: Follow-up type 1 diabetes  Brandy Parks, [name], PA-C   HPI: Brandy Parks  is a 24 y.o. female presenting for follow-up of type 1 diabetes.  She is accompanied to this visit by her mother.  6. Brandy Parks was admitted to the [facility] on 10/01/10 with new onset DM. She had hyperglycemia to 491 but not DKA. Her HbA1c was 12.8% and her C-peptide was 0.64 (normal 0.8-3.0). Brandy Parks was short, quite obese, and had acanthosis nigricans c/w T2DM. However, her insulin requirement was more c/w T1DM and her GAD was positive at 13.5 and her Pancreatic Islet Cell Ab was positive at >80. She was started on MDI with Lantus and Novolog. She was transitioned to Medtronic 530G insulin pump on 12/26/13.  2. Since last visit to [facility] on 09/2019, she has been well. 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 She is upset because she has gained weight. She is getting more fast food now that she is busy. She is wearing Tslim x2 insulin pump with Dexcom CGM. She has not been able to get control Iq set up on pump yet.   Feels like blood sugars have been better.   Concerns:  - weight gain  - Using more insulin.  - Cannot get control IQ working     Insulin regimen: Basal Rates 12AM  1.68  4AM  1.62  8AM  1.85  530pm  1.95  9pm 1.95    Insulin to Carbohydrate Ratio 12AM 15  6AM 7  8 am   8,5   10 am   7.5   5pm 9pm  7.5   8     Insulin Sensitivity Factor 12AM 50  8AM  35  10AM 35  10pm 35      Target Blood Glucose 12AM 150  6AM 120  8AM 150         Hypoglycemia: Able to feel low blood sugars.  No glucagon needed recently.  Insulin pump and CGM download:   Med-alert ID: Not currently wearing. Pump sites: Using abdomen and buttocks.  Changing sites every 3 days Annual labs due: 04/2020 Ophthalmology:  2018 . DIscussed that she is due for eye exam.    3. ROS:  Greater than 10 systems reviewed with pertinent positives listed in HPI, otherwise neg. General: Sleeping well.  + weight gain  Eyes: Denies vision changes. No blurry vision.  Respiratory: No SOB. No trouble breathing.  Cardiac: Denies tachycardia, palpitations, chest pain.  Genitourinary: No polyuria or polydipsia.  Periods are monthly Psychiatric: Normal affect. Denies anxiety and depression.  Skin: No rash, or skin breakdown.  Neuro: denies tingling of feet currently, no pain Endo: denies polyuria, polydipsia.   Past Medical History:   Past Medical History:  Diagnosis Date  . Acanthosis nigricans, acquired   . Diabetes mellitus   . Hypertension   . Obesity     Current Outpatient Medications on File Prior to Visit  Medication Sig Dispense Refill  . ACCU-CHEK FASTCLIX LANCETS MISC 1 each by Does not apply route as directed. Check sugar 6 x daily 204 each 6  . Continuous Blood Gluc Sensor (DEXCOM G6 SENSOR) MISC 1 kit by Does not apply route as directed. Change sensor every 10 days 9 each 1  . Continuous Blood Gluc Transmit (DEXCOM G6 TRANSMITTER) MISC 1 kit by Does not apply route every 3 (three) months. 1 each 1  . glucagon 1  MG injection Use for Severe Hypoglycemia . Inject '1mg'$  intramuscularly if unresponsive, unable to swallow, unconscious and/or has seizure 2 kit 2  . insulin aspart (NOVOLOG) 100 UNIT/ML injection INJECT 300 UNITS OF INSULIN IN PUMP EVERY 48 HOURS 50 mL 6  . lisinopril (ZESTRIL) 5 MG tablet Take by mouth.    Marland Kitchen VIENVA 0.1-20 MG-MCG tablet TK 1 T PO QD    . cetirizine (ZYRTEC) 10 MG tablet Take 10 mg by mouth daily.    . Cetirizine HCl 10 MG CAPS Take 1 capsule (10 mg total) by mouth daily for 10 days. 10 capsule 0  . cyclobenzaprine (FLEXERIL) 10 MG tablet Take 1 tablet (10 mg total) by mouth 2 (two) times daily as needed for muscle spasms. (Patient not taking: Reported on 04/28/2019) 20 tablet 0  . fluticasone (FLONASE) 50 MCG/ACT nasal spray Place 1-2 sprays  into both nostrils daily for 7 days. 1 g 0  . ibuprofen (ADVIL) 800 MG tablet Take 1 tablet (800 mg total) by mouth every 8 (eight) hours as needed. (Patient not taking: Reported on 12/12/2019) 21 tablet 0  . lisinopril (ZESTRIL) 2.5 MG tablet TAKE 1 TABLET BY MOUTH ONCE DAILY (Patient not taking: Reported on 12/12/2019) 30 tablet 5   No current facility-administered medications on file prior to visit.    No Known Allergies  Surgical History: No past surgical history on file.  Family History:  Family History  Problem Relation Age of Onset  . Obesity Mother   . Hypothyroidism Mother   . Hypertension Mother   . Obesity Maternal Aunt   . Hypertension Maternal Aunt   . Obesity Maternal Grandmother   . Hypertension Maternal Grandmother   . Kidney disease Maternal Grandmother   . Heart disease Maternal Grandfather   . Hypertension Maternal Grandfather   . Cancer Paternal Grandfather   . Diabetes Maternal Uncle   . Healthy Father      Social History: Lives with: maternal grandparents, mother Online classes. [place].   Physical Exam:  Vitals:   12/12/19 0941  BP: 132/88  Pulse: 80  Weight: 190 lb 3.2 oz (86.3 kg)   BP 132/88   Pulse 80   Wt 190 lb 3.2 oz (86.3 kg)   LMP 11/19/2019   BMI 39.39 kg/m  Body mass index: body mass index is 39.39 kg/m. Growth percentile SmartLinks can only be used for patients less than 45 years old.  PHYSICAL EXAM General: Well developed, well nourished female in no acute distress.  Head: Normocephalic, atraumatic.   Eyes:  Pupils equal and round. EOMI.   Sclera white.  No eye drainage.   Ears/Nose/Mouth/Throat: Nares patent, no nasal drainage.  Normal dentition, mucous membranes moist.   Neck: supple, no cervical lymphadenopathy, no thyromegaly Cardiovascular: regular rate, normal S1/S2, no murmurs Respiratory: No increased work of breathing.  Lungs clear to auscultation bilaterally.  No wheezes. Abdomen: soft, nontender, nondistended.  Normal bowel sounds.  No appreciable masses  Extremities: warm, well perfused, cap refill < 2 sec.   Musculoskeletal: Normal muscle mass.  Normal strength Skin: warm, dry.  No rash or lesions. Neurologic: alert and oriented, normal speech, no tremor   Labs:   - Last a1c 10 /2020: 8.3%    Results for orders placed or performed in visit on 12/12/19  POCT glycosylated hemoglobin (Hb A1C)  Result Value Ref Range   Hemoglobin A1C 8.4 (A) 4.0 - 5.6 %   HbA1c POC (<> result, manual entry)     HbA1c,  POC (prediabetic range)     HbA1c, POC (controlled diabetic range)    POCT Glucose (Device for Home Use)  Result Value Ref Range   Glucose Fasting, POC     POC Glucose 160 (A) 70 - 99 mg/dl      Assessment/Plan: Loveta is a 25 y.o. female with type 1 diabetes in sub optimal control on insulin pump therapy. She is bolusing late at meals which is allowing blood sugars to have hyperglycemic spike. Needs to bolus before eating. Hemoglobin A1c is 8.4% which is higher then ADA goal. She is taking 5 mg of lisinopril per day for hypertension.   1. Type I diabetes mellitus, uncontrolled/ 2. Hyperglycemia/ 3. Elevated A1c  - Reviewed insulin pump and CGM download. Discussed trends and patterns.  - Rotate pump sites to prevent scar tissue.  - bolus 15 minutes prior to eating to limit blood sugar spikes.  - Reviewed carb counting and importance of accurate carb counting.  - Discussed signs and symptoms of hypoglycemia. Always have glucose available.  - POCT glucose and hemoglobin A1c  - Reviewed growth chart.  - Discussed importance of bolusing before meals.  - email sent to [employer] diabetes educator Stanton Kidney about control IQ upgrade.   4. Insulin pump in place  No changes today.   5. Hypertension - 5 mg of lisinopril per day.   6. Weight gain  - Discussed importance of healthy diet and daily exercise.  - Discussed how weight gain can cause increased insulin needs.  - Answered questions and  reviewed growth chart.   >45 spent today reviewing the medical chart, counseling the patient/family, and documenting today's visit.   When a patient is on insulin, intensive monitoring of blood glucose levels is necessary to avoid hyperglycemia and hypoglycemia. Severe hyperglycemia/hypoglycemia can lead to hospital admissions and be life threatening.    Follow-up:   2 months.     Hermenia Bers,  FNP-C  Pediatric Specialist  [address] [place]  [place], [address]  Tele: [phone]

## 2019-12-20 ENCOUNTER — Ambulatory Visit (INDEPENDENT_AMBULATORY_CARE_PROVIDER_SITE_OTHER): Payer: Medicaid Other | Admitting: Family

## 2019-12-26 ENCOUNTER — Encounter (INDEPENDENT_AMBULATORY_CARE_PROVIDER_SITE_OTHER): Payer: Self-pay

## 2019-12-26 DIAGNOSIS — R739 Hyperglycemia, unspecified: Secondary | ICD-10-CM

## 2019-12-26 DIAGNOSIS — E1065 Type 1 diabetes mellitus with hyperglycemia: Secondary | ICD-10-CM

## 2019-12-26 DIAGNOSIS — R7309 Other abnormal glucose: Secondary | ICD-10-CM

## 2019-12-26 MED ORDER — DEXCOM G6 SENSOR MISC: 1.0000 | 1 refills | Status: DC

## 2019-12-26 MED ORDER — DEXCOM G6 TRANSMITTER MISC: 1.0000 | 1 refills | Status: DC

## 2020-03-14 ENCOUNTER — Telehealth (INDEPENDENT_AMBULATORY_CARE_PROVIDER_SITE_OTHER): Payer: Medicaid Other | Admitting: Family

## 2020-03-14 ENCOUNTER — Encounter (INDEPENDENT_AMBULATORY_CARE_PROVIDER_SITE_OTHER): Payer: Self-pay | Admitting: Family

## 2020-03-14 DIAGNOSIS — Z9641 Presence of insulin pump (external) (internal): Secondary | ICD-10-CM

## 2020-03-14 DIAGNOSIS — E1065 Type 1 diabetes mellitus with hyperglycemia: Secondary | ICD-10-CM

## 2020-03-14 DIAGNOSIS — L83 Acanthosis nigricans: Secondary | ICD-10-CM | POA: Diagnosis not present

## 2020-03-14 DIAGNOSIS — R739 Hyperglycemia, unspecified: Secondary | ICD-10-CM

## 2020-03-14 DIAGNOSIS — R7309 Other abnormal glucose: Secondary | ICD-10-CM

## 2020-03-14 DIAGNOSIS — I10 Essential (primary) hypertension: Secondary | ICD-10-CM | POA: Diagnosis not present

## 2020-03-14 NOTE — Progress Notes (Signed)
This is a Pediatric Specialist E-Visit follow up consult provided via  WebEx Annjanette Mccready consented to an E-Visit consult today.  Location of patient: Emmalynn is at home Location of provider: Melissa Noon is at [facility] office.  Patient was referred by Mancel Bale, PA-C   The following participants were involved in this E-Visit: Jatziri and Lavar Rosenzweig FNP-c   Chief Complain/ Reason for E-Visit today: T1DM Fu  Total time on call: >30 spent today reviewing the medical chart, counseling the patient/family, and documenting today's visit.   Follow up: 3 months.    Pediatric Endocrinology Diabetes Consultation Follow-up Visit  Chief Complaint: Follow-up type 1 diabetes  [name], [name], PA-C   HPI: Jaydee Rennie  is a 24 y.o. female presenting for follow-up of type 1 diabetes.  She is accompanied to this visit by her mother.  70. Oluwadara was admitted to the [facility] on 10/01/10 with new onset DM. She had hyperglycemia to 491 but not DKA. Her HbA1c was 12.8% and her C-peptide was 0.64 (normal 0.8-3.0). Lashelle was short, quite obese, and had acanthosis nigricans c/w T2DM. However, her insulin requirement was more c/w T1DM and her GAD was positive at 13.5 and her Pancreatic Islet Cell Ab was positive at >80. She was started on MDI with Lantus and Novolog. She was transitioned to Medtronic 530G insulin pump on 12/26/13.  2. Since last visit to [facility] on 12/2019, she has been well. 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 She recently upgraded her pump to control IQ and says that it is "probably the best thing that I have done". Feels like her blood sugars are much more controlled. No other concerns today.    Insulin regimen: Basal Rates 12AM  1.68  4AM  1.62  8AM  1.85  530pm  1.95  9pm 1.95    Insulin to Carbohydrate Ratio 12AM 15  6AM 7  8 am   8,5   10 am   7.5   5pm 9pm  7.5   8      Insulin Sensitivity Factor 12AM 50  8AM  35  10AM 35  10pm 35      Target Blood Glucose 12AM 150  6AM 120  8AM 150         Hypoglycemia: Able to feel low blood sugars.  No glucagon needed recently.  Insulin pump and CGM download:   Med-alert ID: Not currently wearing. Pump sites: Using abdomen and buttocks.  Changing sites every 3 days Annual labs due: 04/2020 Ophthalmology:  2018 . DIscussed that she is due for eye exam.    3. ROS: Greater than 10 systems reviewed with pertinent positives listed in HPI, otherwise neg. General: Sleeping well.   Eyes: Denies vision changes. No blurry vision.  Respiratory: No SOB. No trouble breathing.  Cardiac: Denies tachycardia, palpitations, chest pain.  Genitourinary: No polyuria or polydipsia.  Periods are monthly Psychiatric: Normal affect. Denies anxiety and depression.  Skin: No rash, or skin breakdown.  Neuro: denies tingling of feet currently, no pain Endo: denies polyuria, polydipsia.   Past Medical History:   Past Medical History:  Diagnosis Date  . Acanthosis nigricans, acquired   . Diabetes mellitus   . Hypertension   . Obesity     Current Outpatient Medications on File Prior to Visit  Medication Sig Dispense Refill  . Continuous Blood Gluc Sensor (DEXCOM G6 SENSOR) MISC 1 kit  by Does not apply route as directed. Change sensor every 10 days 9 each 1  . Continuous Blood Gluc Transmit (DEXCOM G6 TRANSMITTER) MISC 1 kit by Does not apply route every 3 (three) months. 1 each 1  . insulin aspart (NOVOLOG) 100 UNIT/ML injection INJECT 300 UNITS OF INSULIN IN PUMP EVERY 48 HOURS 50 mL 6  . lisinopril (ZESTRIL) 5 MG tablet Take by mouth.    Marland Kitchen ACCU-CHEK FASTCLIX LANCETS MISC 1 each by Does not apply route as directed. Check sugar 6 x daily (Patient not taking: Reported on 03/14/2020) 204 each 6  . cetirizine (ZYRTEC) 10 MG tablet Take 10 mg by mouth daily. (Patient not taking: Reported on 03/14/2020)    . Cetirizine HCl 10 MG  CAPS Take 1 capsule (10 mg total) by mouth daily for 10 days. 10 capsule 0  . cyclobenzaprine (FLEXERIL) 10 MG tablet Take 1 tablet (10 mg total) by mouth 2 (two) times daily as needed for muscle spasms. (Patient not taking: Reported on 04/28/2019) 20 tablet 0  . fluticasone (FLONASE) 50 MCG/ACT nasal spray Place 1-2 sprays into both nostrils daily for 7 days. 1 g 0  . glucagon 1 MG injection Use for Severe Hypoglycemia . Inject 4m intramuscularly if unresponsive, unable to swallow, unconscious and/or has seizure (Patient not taking: Reported on 03/14/2020) 2 kit 2  . ibuprofen (ADVIL) 800 MG tablet Take 1 tablet (800 mg total) by mouth every 8 (eight) hours as needed. (Patient not taking: Reported on 12/12/2019) 21 tablet 0  . lisinopril (ZESTRIL) 2.5 MG tablet TAKE 1 TABLET BY MOUTH ONCE DAILY (Patient not taking: Reported on 12/12/2019) 30 tablet 5  . VIENVA 0.1-20 MG-MCG tablet TK 1 T PO QD (Patient not taking: Reported on 03/14/2020)     No current facility-administered medications on file prior to visit.    No Known Allergies  Surgical History: No past surgical history on file.  Family History:  Family History  Problem Relation Age of Onset  . Obesity Mother   . Hypothyroidism Mother   . Hypertension Mother   . Obesity Maternal Aunt   . Hypertension Maternal Aunt   . Obesity Maternal Grandmother   . Hypertension Maternal Grandmother   . Kidney disease Maternal Grandmother   . Heart disease Maternal Grandfather   . Hypertension Maternal Grandfather   . Cancer Paternal Grandfather   . Diabetes Maternal Uncle   . Healthy Father      Social History: Lives with: maternal grandparents, mother Online classes. [place]   Physical Exam:  There were no vitals filed for this visit. There were no vitals taken for this visit. Body mass index: body mass index is unknown because there is no height or weight on file. Growth percentile SmartLinks can only be used for patients less than  246years old.  PHYSICAL EXAM General: Well developed, well nourished female in no acute distress.   Head: Normocephalic, atraumatic.   Eyes:  Pupils equal and round. EOMI.   Sclera white.  No eye drainage.   Ears/Nose/Mouth/Throat: Nares patent, no nasal drainage.  Normal dentition, mucous membranes moist.   Neck: supple Cardiovascular: No cyanosis.  Respiratory: No increased work of breathing.   Skin: warm, dry.  No rash or lesions. Neurologic: alert and oriented, normal speech, no tremor    Labs:   - Last a1c: 8.4% on 12/2019  Results for orders placed or performed in visit on 12/12/19  POCT glycosylated hemoglobin (Hb A1C)  Result Value Ref Range  Hemoglobin A1C 8.4 (A) 4.0 - 5.6 %   HbA1c POC (<> result, manual entry)     HbA1c, POC (prediabetic range)     HbA1c, POC (controlled diabetic range)    POCT Glucose (Device for Home Use)  Result Value Ref Range   Glucose Fasting, POC     POC Glucose 160 (A) 70 - 99 mg/dl      Assessment/Plan: Kasey is a 24 y.o. female with type 1 diabetes on TSlim insulin pump therapy. Significant improvement in blood glucose control and time in range since upgrading her pump to Control IQ technology.   1. Type I diabetes mellitus, uncontrolled/ 2. Hyperglycemia/ 3. Elevated A1c  - Reviewed insulin pump and CGM download. Discussed trends and patterns.  - Rotate pump sites to prevent scar tissue.  - bolus 15 minutes prior to eating to limit blood sugar spikes.  - Reviewed carb counting and importance of accurate carb counting.  - Discussed signs and symptoms of hypoglycemia. Always have glucose available.  - POCT glucose and hemoglobin A1c  - Reviewed growth chart.    4. Insulin pump in place  No changes today.   5. Hypertension - 5 mg of lisinopril per day.    >30  spent today reviewing the medical chart, counseling the patient/family, and documenting today's visit. ' When a patient is on insulin, intensive monitoring of blood  glucose levels is necessary to avoid hyperglycemia and hypoglycemia. Severe hyperglycemia/hypoglycemia can lead to hospital admissions and be life threatening.    Follow-up:   3 months.     Hermenia Bers,  FNP-C  Pediatric Specialist  [address] [place]  [place], [address]  Tele: [phone]

## 2020-03-14 NOTE — Patient Instructions (Signed)

## 2020-05-15 ENCOUNTER — Other Ambulatory Visit (INDEPENDENT_AMBULATORY_CARE_PROVIDER_SITE_OTHER): Payer: Self-pay | Admitting: Family

## 2020-07-19 ENCOUNTER — Telehealth (INDEPENDENT_AMBULATORY_CARE_PROVIDER_SITE_OTHER): Payer: Self-pay

## 2020-07-19 ENCOUNTER — Encounter (INDEPENDENT_AMBULATORY_CARE_PROVIDER_SITE_OTHER): Payer: Self-pay

## 2020-07-19 NOTE — Telephone Encounter (Signed)
Brandy Parks (Key: [id number])Need help? Call us at [phone] Status Sent to [employer] The plan will fax you a determination, typically within 1 to 5 business days.  How do I follow up? Drug Dexcom G6 Sensor Form [facility] Standard Drug Request Form Use for [facility] Prior Authorization Request [phone]

## 2020-09-19 ENCOUNTER — Encounter (INDEPENDENT_AMBULATORY_CARE_PROVIDER_SITE_OTHER): Payer: Self-pay

## 2020-09-19 ENCOUNTER — Other Ambulatory Visit (INDEPENDENT_AMBULATORY_CARE_PROVIDER_SITE_OTHER): Payer: Self-pay | Admitting: Family

## 2020-09-19 DIAGNOSIS — E1065 Type 1 diabetes mellitus with hyperglycemia: Secondary | ICD-10-CM

## 2020-09-19 DIAGNOSIS — R7309 Other abnormal glucose: Secondary | ICD-10-CM

## 2020-09-19 DIAGNOSIS — R739 Hyperglycemia, unspecified: Secondary | ICD-10-CM

## 2020-09-26 ENCOUNTER — Encounter (INDEPENDENT_AMBULATORY_CARE_PROVIDER_SITE_OTHER): Payer: Self-pay | Admitting: Family

## 2020-09-26 ENCOUNTER — Ambulatory Visit (INDEPENDENT_AMBULATORY_CARE_PROVIDER_SITE_OTHER): Payer: Medicaid Other | Admitting: Family

## 2020-09-26 ENCOUNTER — Other Ambulatory Visit: Payer: Self-pay

## 2020-09-26 VITALS — BP 118/80 | HR 76 | Wt 195.0 lb

## 2020-09-26 DIAGNOSIS — Z4681 Encounter for fitting and adjustment of insulin pump: Secondary | ICD-10-CM

## 2020-09-26 DIAGNOSIS — E1065 Type 1 diabetes mellitus with hyperglycemia: Secondary | ICD-10-CM | POA: Diagnosis not present

## 2020-09-26 DIAGNOSIS — L83 Acanthosis nigricans: Secondary | ICD-10-CM

## 2020-09-26 DIAGNOSIS — I10 Essential (primary) hypertension: Secondary | ICD-10-CM

## 2020-09-26 DIAGNOSIS — R739 Hyperglycemia, unspecified: Secondary | ICD-10-CM

## 2020-09-26 DIAGNOSIS — R7309 Other abnormal glucose: Secondary | ICD-10-CM

## 2020-09-26 LAB — POCT URINALYSIS DIPSTICK
Glucose, UA: POSITIVE — AB
Ketones, UA: NEGATIVE

## 2020-09-26 LAB — POCT GLYCOSYLATED HEMOGLOBIN (HGB A1C): Hemoglobin A1C: 9.2 % — AB (ref 4.0–5.6)

## 2020-09-26 LAB — POCT GLUCOSE (DEVICE FOR HOME USE): POC Glucose: 435 mg/dL — AB (ref 70–99)

## 2020-09-26 MED ORDER — GLUCOSE BLOOD VI STRP: ORAL_STRIP | 3 refills | Status: AC

## 2020-09-26 NOTE — Progress Notes (Signed)
Pediatric Endocrinology Diabetes Consultation Follow-up Visit  Chief Complaint: Follow-up type 1 diabetes  [name], Brandy Severin, PA-C   HPI: Brandy Parks  is a 25 y.o. female presenting for follow-up of type 1 diabetes.  She is accompanied to this visit by her mother.  1. Brandy Parks was admitted to the [facility] on 10/01/10 with new onset DM. She had hyperglycemia to 491 but not DKA. Her HbA1c was 12.8% and her C-peptide was 0.64 (normal 0.8-3.0). Brandy Parks was short, quite obese, and had acanthosis nigricans c/w T2DM. However, her insulin requirement was more c/w T1DM and her GAD was positive at 13.5 and her Pancreatic Islet Cell Ab was positive at >80. She was started on MDI with Lantus and Novolog. She was transitioned to Medtronic 530G insulin pump on 12/26/13.  2. Since last visit to [facility] on 03/2020, she has been well. No hospitalizations  Using Tslim insulin pump and Dexcom CGM. Both are working well overall. She is bolusing before eating most of the time. She does well with carb counting and is trying to eat healthier.   She has started exercising 2 days per week. She is going for walks and doing zumba.   Concern:  - Dexcom causing skin rash and falling off early.  - Pump battery dying quickly.  - hyperglycemia after dinner.   Taking 2.5 mg of lisinopril per day. Was recently reduced by her PCP after doing serial blood pressure checks at home.   Insulin regimen: Basal Rates 12AM  1.68  4AM  1.62  8AM  1.85  530pm  1.95  9pm 1.95    Insulin to Carbohydrate Ratio 12AM 15  6AM 7  8 am   8,5   10 am   7.5   5pm 9pm  7.5   8     Insulin Sensitivity Factor 12AM 50  8AM  35  10AM 35  10pm 35      Target Blood Glucose 12AM 150  6AM 120  8AM 150         Hypoglycemia: Able to feel low blood sugars.  No glucagon needed recently.  Insulin pump and CGM download:   Med-alert ID: Not currently wearing. Pump sites: Using abdomen and buttocks.  Changing sites  every 3 days Annual labs due: 04/2020 Ophthalmology:  2018 . DIscussed that she is due for eye exam.    3. ROS: Greater than 10 systems reviewed with pertinent positives listed in HPI, otherwise neg. General: Sleeping well.  Weight stable.  Eyes: Denies vision changes. No blurry vision.  Respiratory: No SOB. No trouble breathing.  Cardiac: Denies tachycardia, palpitations, chest pain.  Genitourinary: No polyuria or polydipsia.  Periods are monthly Psychiatric: Normal affect. Denies anxiety and depression.  Skin: No rash, or skin breakdown.  Neuro: denies tingling of feet currently, no pain Endo: denies polyuria, polydipsia.   Past Medical History:   Past Medical History:  Diagnosis Date  . Acanthosis nigricans, acquired   . Diabetes mellitus   . Hypertension   . Obesity     Current Outpatient Medications on File Prior to Visit  Medication Sig Dispense Refill  . Continuous Blood Gluc Sensor (DEXCOM G6 SENSOR) MISC USE AS DIRECTED. CHANGE SENSOR EVERY 10 DAYS 9 each 0  . Continuous Blood Gluc Transmit (DEXCOM G6 TRANSMITTER) MISC 1 kit by Does not apply route every 3 (three) months. 1 each 1  . lisinopril (ZESTRIL) 5 MG tablet Take by mouth.    Marland Kitchen NOVOLOG 100 UNIT/ML injection INJECT  300 UNITS OF INSULIN PUMP EVERY 48 HOURS 50 mL 6  . VIENVA 0.1-20 MG-MCG tablet TK 1 T PO QD    . ACCU-CHEK FASTCLIX LANCETS MISC 1 each by Does not apply route as directed. Check sugar 6 x daily (Patient not taking: Reported on 03/14/2020) 204 each 6  . cetirizine (ZYRTEC) 10 MG tablet Take 10 mg by mouth daily. (Patient not taking: Reported on 03/14/2020)    . Cetirizine HCl 10 MG CAPS Take 1 capsule (10 mg total) by mouth daily for 10 days. 10 capsule 0  . cyclobenzaprine (FLEXERIL) 10 MG tablet Take 1 tablet (10 mg total) by mouth 2 (two) times daily as needed for muscle spasms. (Patient not taking: No sig reported) 20 tablet 0  . fluticasone (FLONASE) 50 MCG/ACT nasal spray Place 1-2 sprays into both  nostrils daily for 7 days. 1 g 0  . glucagon 1 MG injection Use for Severe Hypoglycemia . Inject $Remove'1mg'[id number]$  intramuscularly if unresponsive, unable to swallow, unconscious and/or has seizure (Patient not taking: Reported on 03/14/2020) 2 kit 2  . ibuprofen (ADVIL) 800 MG tablet Take 1 tablet (800 mg total) by mouth every 8 (eight) hours as needed. (Patient not taking: Reported on 12/12/2019) 21 tablet 0  . lisinopril (ZESTRIL) 2.5 MG tablet TAKE 1 TABLET BY MOUTH ONCE DAILY (Patient not taking: Reported on 12/12/2019) 30 tablet 5   No current facility-administered medications on file prior to visit.    No Known Allergies  Surgical History: History reviewed. No pertinent surgical history.  Family History:  Family History  Problem Relation Age of Onset  . Obesity Mother   . Hypothyroidism Mother   . Hypertension Mother   . Obesity Maternal Aunt   . Hypertension Maternal Aunt   . Obesity Maternal Grandmother   . Hypertension Maternal Grandmother   . Kidney disease Maternal Grandmother   . Heart disease Maternal Grandfather   . Hypertension Maternal Grandfather   . Cancer Paternal Grandfather   . Diabetes Maternal Uncle   . Healthy Father      Social History: Lives with: maternal grandparents, mother Online classes. [place].   Physical Exam:  Vitals:   09/26/20 1550  BP: 118/80  Pulse: 76  Weight: 195 lb (88.5 kg)   BP 118/80   Pulse 76   Wt 195 lb (88.5 kg)   BMI 40.38 kg/m  Body mass index: body mass index is 40.38 kg/m. Growth percentile SmartLinks can only be used for patients less than 67 years old.  PHYSICAL EXAM General: Well developed, well nourished female in no acute distress.   Head: Normocephalic, atraumatic.   Eyes:  Pupils equal and round. EOMI.   Sclera white.  No eye drainage.   Ears/Nose/Mouth/Throat: Nares patent, no nasal drainage.  Normal dentition, mucous membranes moist.   Neck: supple, no cervical lymphadenopathy, no thyromegaly Cardiovascular:  regular rate, normal S1/S2, no murmurs Respiratory: No increased work of breathing.  Lungs clear to auscultation bilaterally.  No wheezes. Abdomen: soft, nontender, nondistended. Normal bowel sounds.  No appreciable masses  Extremities: warm, well perfused, cap refill < 2 sec.   Musculoskeletal: Normal muscle mass.  Normal strength Skin: warm, dry.  No rash or lesions. Neurologic: alert and oriented, normal speech, no tremor   Labs:    Results for orders placed or performed in visit on 09/26/20  POCT glycosylated hemoglobin (Hb A1C)  Result Value Ref Range   Hemoglobin A1C 9.2 (A) 4.0 - 5.6 %   HbA1c POC (<>  result, manual entry)     HbA1c, POC (prediabetic range)     HbA1c, POC (controlled diabetic range)    POCT Glucose (Device for Home Use)  Result Value Ref Range   Glucose Fasting, POC     POC Glucose 435 (A) 70 - 99 mg/dl  POCT urinalysis dipstick  Result Value Ref Range   Color, UA     Clarity, UA     Glucose, UA Positive (A) Negative   Bilirubin, UA     Ketones, UA negative    Spec Grav, UA     Blood, UA     pH, UA     Protein, UA     Urobilinogen, UA     Nitrite, UA     Leukocytes, UA     Appearance     Odor        Assessment/Plan: Lonna is a 25 y.o. female with type 1 diabetes on TSlim insulin pump therapy. Blood sugars have improved when she is using control IQ on insulin pump. Having a pattern of hyperglycemia between 6pm-12am. Discussed importance of consistent use of CGM. Her hemoglobin A1c is 9.2% today which is higher then ADA goal of <7%. She will transition to adult endocrinology after today's visit.   1. Type I diabetes mellitus, uncontrolled/ 2. Hyperglycemia/ 3. Elevated A1c  - Reviewed insulin pump and CGM download. Discussed trends and patterns.  - Rotate pump sites to prevent scar tissue.  - bolus 15 minutes prior to eating to limit blood sugar spikes.  - Reviewed carb counting and importance of accurate carb counting.  - Discussed signs  and symptoms of hypoglycemia. Always have glucose available.  - POCT glucose and hemoglobin A1c  - Reviewed growth chart.  - Encouraged to use Skin Tac and over patches to help with dexcom adhesion. Avoid lotion use to areas where applying dexcom.   4. Insulin pump in place  12AM 15  6AM 7  8 am   8,5   10 am   7.5   5pm 9pm  7.5 --> 6.5  8 --> 7     5. Hypertension - 2.5 mg of Lisinopril per day.   >45spent today reviewing the medical chart, counseling the patient/family, and documenting today's visit.  When a patient is on insulin, intensive monitoring of blood glucose levels is necessary to avoid hyperglycemia and hypoglycemia. Severe hyperglycemia/hypoglycemia can lead to hospital admissions and be life threatening.     Hermenia Bers,  FNP-C  Pediatric Specialist  [address] [place]  [place], [address]  Tele: [phone]

## 2020-09-26 NOTE — Patient Instructions (Addendum)
Hypoglycemia  . Shaking or trembling. . Sweating and chills. . Dizziness or lightheadedness. . Faster heart rate. Marland Kitchen Headaches. . Hunger. . Nausea. . Nervousness or irritability. . Pale skin. Marland Kitchen Restless sleep. . Weakness. Kennis Carina vision. . Confusion or trouble concentrating. . Sleepiness. . Slurred speech. . Tingling or numbness in the face or mouth.  How do I treat an episode of hypoglycemia? The American Diabetes Association recommends the "15-15 rule" for an episode of hypoglycemia: . Eat or drink 15 grams of carbs to raise your blood sugar. . After 15 minutes, check your blood sugar. . If it's still below 70 mg/dL, have another 15 grams of carbs. . Repeat until your blood sugar is at least 70 mg/dL.  Hyperglycemia  . Frequent urination . Increased thirst . Blurred vision . Fatigue . Headache Diabetic Ketoacidosis (DKA)  If hyperglycemia goes untreated, it can cause toxic acids (ketones) to build up in your blood and urine (ketoacidosis). Signs and symptoms include: . Fruity-smelling breath . Nausea and vomiting . Shortness of breath . Dry mouth . Weakness . Confusion . Coma . Abdominal pain        Sick day/Ketones Protocol  . Check blood glucose every 2 hours  . Check urine ketones every 2 hours (until ketones are clear)  . Drink plenty of fluids (water, Pedialyte) hourly . Give rapid acting insulin correction dose every 3 hours until ketones are clear  . Notify clinic of sickness/ketones  . If you develop signs of DKA, go to ER immediately.   Hemoglobin A1c levels     - Skin tac - amazon Dexcom patches

## 2020-11-14 ENCOUNTER — Other Ambulatory Visit (INDEPENDENT_AMBULATORY_CARE_PROVIDER_SITE_OTHER): Payer: Self-pay | Admitting: Family

## 2020-11-14 DIAGNOSIS — E1065 Type 1 diabetes mellitus with hyperglycemia: Secondary | ICD-10-CM

## 2020-11-14 DIAGNOSIS — R739 Hyperglycemia, unspecified: Secondary | ICD-10-CM

## 2020-11-14 DIAGNOSIS — R7309 Other abnormal glucose: Secondary | ICD-10-CM

## 2020-12-26 ENCOUNTER — Encounter: Payer: Self-pay | Admitting: Internal Medicine

## 2020-12-26 ENCOUNTER — Ambulatory Visit (INDEPENDENT_AMBULATORY_CARE_PROVIDER_SITE_OTHER): Payer: Medicaid Other | Admitting: Internal Medicine

## 2020-12-26 ENCOUNTER — Other Ambulatory Visit: Payer: Self-pay

## 2020-12-26 VITALS — BP 132/88 | HR 102 | Ht <= 58 in | Wt 195.0 lb

## 2020-12-26 DIAGNOSIS — E1065 Type 1 diabetes mellitus with hyperglycemia: Secondary | ICD-10-CM

## 2020-12-26 MED ORDER — INSULIN ASPART 100 UNIT/ML IJ SOLN: INTRAMUSCULAR | 3 refills | Status: DC

## 2020-12-26 MED ORDER — TRULICITY 0.75 MG/0.5ML ~~LOC~~ SOAJ: 0.7500 mg | SUBCUTANEOUS | 4 refills | Status: DC

## 2020-12-26 NOTE — Progress Notes (Signed)
Name: Brandy Parks  MRN/ DOB: [id number], Jan 06, 1996   Age/ Sex: 25 y.o., female    PCP: Mittie Bodo   Reason for Endocrinology Evaluation: Type 1 Diabetes Mellitus     Date of Initial Endocrinology Visit: 12/26/2020     Brandy Parks IDENTIFIER: Brandy Parks is a 25 y.o. female with a past medical history of T1DM. The Brandy Parks presented for initial endocrinology clinic visit on 12/26/2020 for consultative assistance with Brandy Parks diabetes management.    HPI: Brandy Parks was    Diagnosed with DM in 2012 at age 24 Prior Medications tried/Intolerance: Has been on a pump 6 yrs , initially was on Medtronic but has been T-slim for 3 yrs  Currently checking blood sugars multiple  x / day, through the dexcom.  Hypoglycemia episodes : yes               Symptoms: yes                 Frequency: 2/ week- early morning  Hemoglobin A1c has ranged from 7.2% in 2012, peaking at 12.0% in 2017. Brandy Parks required assistance for hypoglycemia: no Brandy Parks has required hospitalization within the last 1 year from hyper or hypoglycemia: no   In terms of diet, the Brandy Parks eats 3 meals a day, snacks occasionally. Drinks sweet occasionally.   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 During the visit the pump basal and bolus doses were reviewed including carb/insulin rations and supplemental doses. The clinical list was updated. The glucose meter download was reviewed in detail to determine if the current pump settings are providing the best glycemic control without excessive hypoglycemia.  Pump and meter download:  T:slim   Basal rate Brandy Parks: C ratio SF BG target  0000 1.670 15 50 150  0300 1.620 15 50 150  0600 1.800 8 50 120  0800 1.850 8 35 120  10 00 1.850 7.5 35 120  1700 1.900 6.5 35 120  2100 1.950 7 35 120             Type & Model of Pump: T-slim   Insulin Type: Currently using Novolog .  Body mass index is 40.76 kg/m.  PUMP STATISTICS: Average BG: 375 BG Readings: 241.86 /day Average Daily Carbs (g): 162 Average Total Daily Insulin: 83.48 Average Daily Basal: 50.59 (61% %) Average Daily Bolus: 29 (37 %)       Statin: no ACE-Brandy Parks/ARB: Yes Prior Diabetic Education: yes   DIABETIC COMPLICATIONS: Microvascular complications:   Denies: retinopathy, retinopathy, CKD  Last eye exam: Completed yrs ago   Macrovascular complications:   Denies: CAD, PVD, CVA   PAST HISTORY: Past Medical History:  Past Medical History:  Diagnosis Date   Acanthosis nigricans, acquired    Diabetes mellitus    Hypertension    Obesity    Past Surgical History: No past surgical history on file.  Social History:  reports that Brandy Parks has never smoked. Brandy Parks has never used smokeless tobacco. Brandy Parks reports that Brandy Parks does not drink alcohol and does not use drugs. Family History:  Family History  Problem Relation Age of Onset   Obesity Mother    Hypothyroidism Mother    Hypertension Mother    Obesity Maternal Aunt    Hypertension Maternal Aunt    Obesity Maternal Grandmother    Hypertension  Maternal Grandmother    Kidney disease Maternal Grandmother    Heart disease Maternal Grandfather    Hypertension Maternal Grandfather    Cancer Paternal Grandfather    Diabetes Maternal Uncle    Healthy Father      HOME MEDICATIONS: Allergies as of 12/26/2020   No Known Allergies      Medication List        Accurate as of December 26, 2020  9:04 AM. If you have any questions, ask your nurse or doctor.          STOP taking these medications    cyclobenzaprine 10 MG tablet Commonly known as: FLEXERIL Stopped by: Dorita Sciara, MD       TAKE these medications    Accu-Chek FastClix Lancets Misc 1 each by Does not apply route as directed. Check sugar 6 x daily   Cetirizine HCl 10 MG Caps Take 1 capsule (10 mg total) by mouth  daily for 10 days. What changed: Another medication with the same name was removed. Continue taking this medication, and follow the directions you see here. Changed by: Dorita Sciara, MD   Dexcom G6 Sensor Misc CHANGE SENSOR EVERY 10 DAYS   Dexcom G6 Transmitter Misc 1 kit by Does not apply route every 3 (three) months.   fluticasone 50 MCG/ACT nasal spray Commonly known as: FLONASE Place 1-2 sprays into both nostrils daily for 7 days.   glucagon 1 MG injection Use for Severe Hypoglycemia . Inject 74m intramuscularly if unresponsive, unable to swallow, unconscious and/or has seizure   glucose blood test strip Up to 6 checks per day   ibuprofen 800 MG tablet Commonly known as: ADVIL Take 1 tablet (800 mg total) by mouth every 8 (eight) hours as needed.   lisinopril 5 MG tablet Commonly known as: ZESTRIL Take by mouth.   lisinopril 2.5 MG tablet Commonly known as: ZESTRIL TAKE 1 TABLET BY MOUTH ONCE DAILY   NovoLOG 100 UNIT/ML injection Generic drug: insulin aspart INJECT 300 UNITS OF INSULIN PUMP EVERY 48 HOURS   Vienva 0.1-20 MG-MCG tablet Generic drug: levonorgestrel-ethinyl estradiol TK 1 T PO QD         ALLERGIES: No Known Allergies   REVIEW OF SYSTEMS: A comprehensive ROS was conducted with the Brandy Parks and is negative except as per HPI and below:  Review of Systems  Gastrointestinal:  Negative for nausea and vomiting.  Genitourinary:  Negative for frequency.  Endo/Heme/Allergies:  Negative for polydipsia.     OBJECTIVE:   VITAL SIGNS: BP 132/88   Pulse (!) 102   Ht _0  (1.473 m)   Wt 195 lb (88.5 kg)   SpO2 96%   BMI 40.76 kg/m       PHYSICAL EXAM:  General: Pt appears well and is in NAD  Hydration: Well-hydrated with moist mucous membranes and good skin turgor  HEENT: Head: Unremarkable with good dentition. Oropharynx clear without exudate.  Eyes: External eye exam normal without stare, lid lag or exophthalmos.  EOM intact.   PERRL.  Neck: General: Supple without adenopathy or carotid bruits. Thyroid: Thyroid size normal.  No goiter or nodules appreciated. No thyroid bruit.  Lungs: Clear with good BS bilat with no rales, rhonchi, or wheezes  Heart: RRR with normal S1 and S2 and no gallops; no murmurs; no rub  Abdomen: Normoactive bowel sounds, soft, nontender, without masses or organomegaly palpable  Extremities:  Lower extremities - No pretibial edema. No lesions.  Skin: Normal texture and temperature  to palpation. No rash noted. No Acanthosis nigricans/skin tags. No lipohypertrophy.  Neuro: MS is good with appropriate affect, pt is alert and Ox3    DM foot exam: 12/26/2020   The skin of the feet is intact without sores or ulcerations. The pedal pulses are 2+ on right and 2+ on left. The sensation is intact to a screening 5.07, 10 gram monofilament bilaterally  DATA REVIEWED:  Lab Results  Component Value Date   HGBA1C 9.2 (A) 09/26/2020   HGBA1C 8.4 (A) 12/12/2019   HGBA1C 8.3 (A) 09/16/2019   Lab Results  Component Value Date   MICROALBUR 0.5 04/13/2018   LDLCALC 82 04/13/2018   CREATININE 0.79 02/17/2019   Lab Results  Component Value Date   MICRALBCREAT 2 04/13/2018    Lab Results  Component Value Date   CHOL 147 04/13/2018   HDL 49 04/13/2018   LDLCALC 82 04/13/2018   TRIG 82 04/13/2018   CHOLHDL 3.0 04/13/2018        ASSESSMENT / PLAN / RECOMMENDATIONS:   1) Type 1 Diabetes Mellitus with insulin resistance, poorly controlled, With out complications - Most recent A1c of 8.7 %. Goal A1c <7.0%.    Plan: GENERAL: Brandy Parks have discussed with the Brandy Parks the pathophysiology of diabetes. We went over the natural progression of the disease. We talked about both insulin resistance and insulin deficiency. We stressed the importance of lifestyle changes including diet and exercise. Brandy Parks explained the complications associated with diabetes including retinopathy, nephropathy, neuropathy as well as  increased risk of cardiovascular disease. We went over the benefit seen with glycemic control.   Brandy Parks explained to the Brandy Parks that diabetic patients are at higher than normal risk for amputations.  The Brandy Parks endorses hypoglycemia at night, in review of Brandy Parks pump download today Brandy Parks has been noted with tight BG's at night starting after 3 AM, Brandy Parks am going to change Brandy Parks basal rate around that time, Brandy Parks am also going to adjust Brandy Parks insulin to carb ratio throughout due to hyperglycemia noted during the day postprandial. -We have discussed add-on therapy to improve Brandy Parks insulin resistance we discussed options such as metformin as well as GLP-1 agonist, Brandy Parks have cautioned Brandy Parks against GI side effects, Brandy Parks would like to give this a try     MEDICATIONS: Continue NovoLog per pump settings Start Trulicity 2.99 mg weekly     Basal rate Brandy Parks: C ratio SF BG target  0000 1.670 12 50 150  0300 1.460 12 50 150  0600 1.800 7 50 120  0800 1.850 7 35 120  10 00 1.850 7 35 120  1700 1.900 6 35 120  2100 1.950 6 35 120          EDUCATION / INSTRUCTIONS: BG monitoring instructions: Brandy Parks is instructed to check Brandy Parks blood sugars 4 times a day, before meals and bedtime. Call [facility] if: BG persistently < 70  Brandy Parks reviewed the Rule of 15 for the treatment of hypoglycemia in detail with the Brandy Parks. Literature supplied.   2) Diabetic complications:  Eye: Does night have known diabetic retinopathy.  Neuro/ Feet: Does night have known diabetic peripheral neuropathy. Renal: Brandy Parks does not have known baseline CKD. Brandy Parks is on an ACEI/ARB at present.    Follow-up in 3 months     Signed electronically by: Mack Guise, MD  [facility]  [facility] [address] [place], [place] [address] Phone: [phone] FAX: [phone]   CC: Mancel Bale,  PA-C [address] [place] [address] Phone: [phone]  Fax:  [phone]    Return to Endocrinology clinic as below: No future appointments.

## 2020-12-26 NOTE — Patient Instructions (Signed)
-   Start Trulicity 0.75 mg weekly    HOW TO TREAT LOW BLOOD SUGARS (Blood sugar LESS THAN 70 MG/DL) Please follow the RULE OF 15 for the treatment of hypoglycemia treatment (when your (blood sugars are less than 70 mg/dL)   STEP 1: Take 15 grams of carbohydrates when your blood sugar is low, which includes:  3-4 GLUCOSE TABS  OR 3-4 OZ OF JUICE OR REGULAR SODA OR ONE TUBE OF GLUCOSE GEL    STEP 2: RECHECK blood sugar in 15 MINUTES STEP 3: If your blood sugar is still low at the 15 minute recheck --> then, go back to STEP 1 and treat AGAIN with another 15 grams of carbohydrates.

## 2020-12-27 ENCOUNTER — Telehealth: Payer: Self-pay | Admitting: Pharmacy Technician

## 2020-12-27 NOTE — Telephone Encounter (Signed)
[facility] Patient Advocate Encounter  Prior Authorization for TRULICITY has been approved.    PA# [id number] Effective dates: 12/27/2020 through 12/27/2021.     [facility] will continue to follow.   Netty Starring. Dimas Aguas, CPhT Patient Advocate  Endocrinology Clinic Phone: [phone] Fax:  [phone]

## 2021-03-18 ENCOUNTER — Other Ambulatory Visit (INDEPENDENT_AMBULATORY_CARE_PROVIDER_SITE_OTHER): Payer: Self-pay | Admitting: Family

## 2021-03-18 DIAGNOSIS — R739 Hyperglycemia, unspecified: Secondary | ICD-10-CM

## 2021-03-18 DIAGNOSIS — R7309 Other abnormal glucose: Secondary | ICD-10-CM

## 2021-03-18 DIAGNOSIS — E1065 Type 1 diabetes mellitus with hyperglycemia: Secondary | ICD-10-CM

## 2021-03-28 ENCOUNTER — Encounter: Payer: Self-pay | Admitting: Internal Medicine

## 2021-03-28 ENCOUNTER — Ambulatory Visit (INDEPENDENT_AMBULATORY_CARE_PROVIDER_SITE_OTHER): Payer: Medicaid Other | Admitting: Internal Medicine

## 2021-03-28 ENCOUNTER — Other Ambulatory Visit: Payer: Self-pay

## 2021-03-28 ENCOUNTER — Other Ambulatory Visit (INDEPENDENT_AMBULATORY_CARE_PROVIDER_SITE_OTHER): Payer: Self-pay | Admitting: Family

## 2021-03-28 VITALS — BP 134/90 | HR 99 | Ht <= 58 in | Wt 190.6 lb

## 2021-03-28 DIAGNOSIS — R7309 Other abnormal glucose: Secondary | ICD-10-CM

## 2021-03-28 DIAGNOSIS — E1065 Type 1 diabetes mellitus with hyperglycemia: Secondary | ICD-10-CM

## 2021-03-28 DIAGNOSIS — R739 Hyperglycemia, unspecified: Secondary | ICD-10-CM

## 2021-03-28 LAB — POCT GLYCOSYLATED HEMOGLOBIN (HGB A1C): Hemoglobin A1C: 7.5 % — AB (ref 4.0–5.6)

## 2021-03-28 MED ORDER — TRULICITY 1.5 MG/0.5ML ~~LOC~~ SOAJ: 1.5000 mg | SUBCUTANEOUS | 3 refills | Status: DC

## 2021-03-28 MED ORDER — DEXCOM G6 TRANSMITTER MISC: 1.0000 | 3 refills | Status: DC

## 2021-03-28 NOTE — Patient Instructions (Signed)
Increase trulicity to 1.5 mg weekly        Basal rate  0000 1.670  0300 1.200  0600 1.800  0800 1.850  1000 2.00  1700 1.900  2100 1.950

## 2021-03-28 NOTE — Progress Notes (Signed)
Name: Brandy Parks  Age/ Sex: 24 y.o., female   MRN/ DOB: [id number], 05/26/1996     PCP: [name], Sarah L, PA-C   Reason for Endocrinology Evaluation: Type 1 Diabetes Mellitus  Initial Endocrine Consultative Visit: 12/26/2020    PATIENT IDENTIFIER: [name] is a 24 y.o. female with a past medical history of T1DM. The patient has followed with Endocrinology clinic since 12/26/2020 for consultative assistance with management of her diabetes.  DIABETIC HISTORY:  [name] was diagnosed with DM in 2012 at age 15, has been on an insulin pump for years, initially was on medtronic subsequently switched to T-slim~ 2019. Her hemoglobin A1c has ranged from 7.2% in 2012, peaking at 12.0% in 2017. She finished cosmetology school in 2018  SUBJECTIVE:   During the last visit (12/26/2020): A1c 8.7% , continued insulin pump and started Trulicity   Today (03/28/2021): [name] is here for a follow up on diabetes.  She checks her blood sugars multiple  times daily, has been out of the transmitter for the past week and a half . The patient has not  had hypoglycemic episodes since the last clinic visit   Started trulicity denies nausea but has noticed fatigue     Pump and meter download:   T:slim     Basal rate I: C ratio SF BG target  0000 1.670 15 50 150  0300 1.620 15 50 150  0600 1.800 8 50 120  0800 1.850 8 35 120  10 00 1.850 7.5 35 120  1700 1.900 6.5 35 120  2100 1.950 7 35 120                       Type & Model of Pump: T-slim  Insulin Type: Currently using Novolog .   Body mass index is 40.76 kg/m.   PUMP STATISTICS: Average BG: 242 Average Daily Carbs (g): 125 Average Total Daily Insulin: 62.35 Average Daily Basal: 37.75 (61% %) Average Daily Bolus: 16.83 (39 %)               HOME DIABETES REGIMEN:  Novolog  Trulicity 0.75 mg weekly   Statin: no ACE-I/ARB: yes     DIABETIC COMPLICATIONS: Microvascular complications:   Denies: CKD,  retinopathy, neuropathy Last Eye Exam: Completed yrs ago  Macrovascular complications:   Denies: CAD, CVA, PVD   HISTORY:  Past Medical History:  Past Medical History:  Diagnosis Date   Acanthosis nigricans, acquired    Diabetes mellitus    Hypertension    Obesity    Past Surgical History: No past surgical history on file. Social History:  reports that she has never smoked. She has never used smokeless tobacco. She reports that she does not drink alcohol and does not use drugs. Family History:  Family History  Problem Relation Age of Onset   Obesity Mother    Hypothyroidism Mother    Hypertension Mother    Obesity Maternal Aunt    Hypertension Maternal Aunt    Obesity Maternal Grandmother    Hypertension Maternal Grandmother    Kidney disease Maternal Grandmother    Heart disease Maternal Grandfather    Hypertension Maternal Grandfather    Cancer Paternal Grandfather    Diabetes Maternal Uncle    Healthy Father      HOME MEDICATIONS: Allergies as of 03/28/2021   No Known Allergies      Medication List        Accurate as of March 28, 2021    7:17 AM. If you have any questions, ask your nurse or doctor.          Accu-Chek FastClix Lancets Misc 1 each by Does not apply route as directed. Check sugar 6 x daily   Cetirizine HCl 10 MG Caps Take 1 capsule (10 mg total) by mouth daily for 10 days.   Dexcom G6 Sensor Misc CHANGE SENSOR EVERY 10 DAYS   Dexcom G6 Transmitter Misc 1 kit by Does not apply route every 3 (three) months.   fluticasone 50 MCG/ACT nasal spray Commonly known as: FLONASE Place 1-2 sprays into both nostrils daily for 7 days.   glucagon 1 MG injection Use for Severe Hypoglycemia . Inject 47m intramuscularly if unresponsive, unable to swallow, unconscious and/or has seizure   glucose blood test strip Up to 6 checks per day   ibuprofen 800 MG tablet Commonly known as: ADVIL Take 1 tablet (800 mg total) by mouth every 8 (eight)  hours as needed.   insulin aspart 100 UNIT/ML injection Commonly known as: NovoLOG Max Daily 100 units   lisinopril 5 MG tablet Commonly known as: ZESTRIL Take by mouth.   lisinopril 2.5 MG tablet Commonly known as: ZESTRIL TAKE 1 TABLET BY MOUTH ONCE DAILY   Trulicity 00.27MXA/1.2INSopn Generic drug: Dulaglutide Inject 0.75 mg into the skin once a week.   Vienva 0.1-20 MG-MCG tablet Generic drug: levonorgestrel-ethinyl estradiol TK 1 T PO QD         OBJECTIVE:   Vital Signs: BP 134/90 (BP Location: Left Arm, Patient Position: Sitting, Cuff Size: Small)   Pulse 99   Ht 4' 10" (1.473 m)   Wt 190 lb 9.6 oz (86.5 kg)   SpO2 99%   BMI 39.84 kg/m   Wt Readings from Last 3 Encounters:  12/26/20 195 lb (88.5 kg)  09/26/20 195 lb (88.5 kg)  12/12/19 190 lb 3.2 oz (86.3 kg)     Exam: General: Pt appears well and is in NAD  Lungs: Clear with good BS bilat with no rales, rhonchi, or wheezes  Heart: RRR with normal S1 and S2 and no gallops; no murmurs; no rub  Abdomen: Normoactive bowel sounds, soft, nontender, without masses or organomegaly palpable  Extremities: No pretibial edema.  Neuro: MS is good with appropriate affect, pt is alert and Ox3  DM foot exam: 12/26/2020     The skin of the feet is intact without sores or ulcerations. The pedal pulses are 2+ on right and 2+ on left. The sensation is intact to a screening 5.07, 10 gram monofilament bilaterally      DATA REVIEWED:  Lab Results  Component Value Date   HGBA1C 9.2 (A) 09/26/2020   HGBA1C 8.4 (A) 12/12/2019   HGBA1C 8.3 (A) 09/16/2019   Lab Results  Component Value Date   MICROALBUR 0.5 04/13/2018   LDLCALC 82 04/13/2018   CREATININE 0.79 02/17/2019   Lab Results  Component Value Date   MICRALBCREAT 2 04/13/2018     Lab Results  Component Value Date   CHOL 147 04/13/2018   HDL 49 04/13/2018   LDLCALC 82 04/13/2018   TRIG 82 04/13/2018   CHOLHDL 3.0 04/13/2018         ASSESSMENT  / PLAN / RECOMMENDATIONS:   1) Type 1 Diabetes Mellitus, with insulin resistance, Sub-Optimally controlled, With out complications - Most recent A1c of 7.5 %. Goal A1c < 7.0 %.    -A1c down from 9.2%, I have praised the patient on improved glycemic  control -Unfortunately she has not had a transmitter for the past week and a half, she was provided with a sample today and a new refill has been sent -I also have praised the patient on weight loss -I am going to increase her Trulicity since she is not having any side effects to it, I am going to reduce her basal rate at night but increase it during the day to improve her glycemic control   MEDICATIONS: Increase Trulicity to 1.5 mg weekly    Basal rate I: C ratio SF BG target  0000 1.670 15 50 150  0300 1.200 15 50 150  0600 1.800 8 50 120  0800 1.850 8 35 120  10 00 2.00 7.5 35 120  1700 1.900 6.5 35 120  2100 1.950 7 35 120               EDUCATION / INSTRUCTIONS: BG monitoring instructions: Patient is instructed to check her blood sugars 3 times a day, before meals . Call [facility] if: BG persistently < 70  I reviewed the Rule of 15 for the treatment of hypoglycemia in detail with the patient. Literature supplied.    2) Diabetic complications:  Eye: Does not have known diabetic retinopathy.  Neuro/ Feet: Does not have known diabetic peripheral neuropathy .  Renal: Patient does not have known baseline CKD. She   is  on an ACEI/ARB at present.      F/U in 4 months   Signed electronically by: Abby Jaralla Shamleffer, MD  [facility]  [facility] [address] [place], [place] [address] Phone: [phone] FAX: [phone]   CC: [name], Sarah L, PA-C [address] [place] [address] Phone: [phone]  Fax: [phone]  Return to Endocrinology clinic as below: Future Appointments  Date Time Provider Department Center  03/28/2021  8:50 AM  Shamleffer, Ibtehal Jaralla, MD [facility] None      

## 2021-04-01 ENCOUNTER — Other Ambulatory Visit (HOSPITAL_COMMUNITY): Payer: Self-pay

## 2021-04-01 ENCOUNTER — Telehealth: Payer: Self-pay | Admitting: Pharmacy Technician

## 2021-04-01 NOTE — Telephone Encounter (Signed)
Patient Advocate Encounter   Received notification from [employer] that prior authorization for DEXCOM G6 TRANSMITTER is required.   PA submitted on 04/01/21 Key [id number] Status is pending    [facility] will continue to follow   Montez Morita, CPhT Patient Advocate [facility] Phone: [phone] Fax:  [phone]

## 2021-04-09 ENCOUNTER — Other Ambulatory Visit (HOSPITAL_COMMUNITY): Payer: Self-pay

## 2021-04-09 NOTE — Telephone Encounter (Signed)
[facility] Patient Advocate Encounter  Received notification from [employer] that the request for prior authorization for [facility] transmitter has been denied due to not meeting requirements.  (However, the pt does have an insulin pump, which meets the requirements)  I called to see if we could update the information without doing an appeal. Test claims process at $0 copay.  PA reopened with new # [id number]  This encounter will continue to be updated until final determination.    Sherilyn Dacosta, CPhT [facility] Patient Advocate [facility] Phone: [phone] Fax:  [phone]

## 2021-04-23 ENCOUNTER — Other Ambulatory Visit (HOSPITAL_COMMUNITY): Payer: Self-pay

## 2021-04-23 NOTE — Telephone Encounter (Signed)
Received notification from [employer] regarding a prior authorization for [facility] TRANSMITTER. Authorization has been APPROVED.  $0 CO-PAY

## 2021-06-06 ENCOUNTER — Other Ambulatory Visit (INDEPENDENT_AMBULATORY_CARE_PROVIDER_SITE_OTHER): Payer: Self-pay | Admitting: Family

## 2021-06-06 DIAGNOSIS — R739 Hyperglycemia, unspecified: Secondary | ICD-10-CM

## 2021-06-06 DIAGNOSIS — E1065 Type 1 diabetes mellitus with hyperglycemia: Secondary | ICD-10-CM

## 2021-06-06 DIAGNOSIS — R7309 Other abnormal glucose: Secondary | ICD-10-CM

## 2021-06-07 ENCOUNTER — Other Ambulatory Visit (INDEPENDENT_AMBULATORY_CARE_PROVIDER_SITE_OTHER): Payer: Self-pay | Admitting: Family

## 2021-06-07 DIAGNOSIS — R739 Hyperglycemia, unspecified: Secondary | ICD-10-CM

## 2021-06-07 DIAGNOSIS — R7309 Other abnormal glucose: Secondary | ICD-10-CM

## 2021-06-07 DIAGNOSIS — E1065 Type 1 diabetes mellitus with hyperglycemia: Secondary | ICD-10-CM

## 2021-06-10 ENCOUNTER — Encounter: Payer: Self-pay | Admitting: Internal Medicine

## 2021-06-10 ENCOUNTER — Other Ambulatory Visit: Payer: Self-pay

## 2021-06-10 DIAGNOSIS — R7309 Other abnormal glucose: Secondary | ICD-10-CM

## 2021-06-10 DIAGNOSIS — R739 Hyperglycemia, unspecified: Secondary | ICD-10-CM

## 2021-06-10 DIAGNOSIS — E1065 Type 1 diabetes mellitus with hyperglycemia: Secondary | ICD-10-CM

## 2021-06-10 MED ORDER — DEXCOM G6 SENSOR MISC
3 refills | Status: DC
Start: 2021-06-10 — End: 2021-10-02

## 2021-07-04 ENCOUNTER — Telehealth (INDEPENDENT_AMBULATORY_CARE_PROVIDER_SITE_OTHER): Payer: Self-pay

## 2021-07-04 NOTE — Telephone Encounter (Signed)
Received paperwork from [facility]. Patient is no longer under providers are. Faxed back to [facility].

## 2021-07-31 ENCOUNTER — Ambulatory Visit: Payer: Medicaid Other | Admitting: Internal Medicine

## 2021-08-05 ENCOUNTER — Ambulatory Visit: Payer: Medicaid Other | Admitting: Internal Medicine

## 2021-08-05 ENCOUNTER — Other Ambulatory Visit (HOSPITAL_COMMUNITY): Payer: Self-pay

## 2021-08-05 ENCOUNTER — Telehealth: Payer: Self-pay

## 2021-08-05 NOTE — Telephone Encounter (Signed)
Received notification from [employer] [facility]) regarding a prior authorization for DEXCOM G6 SENSORS. Authorization has been APPROVED from 1.30.23 to 1.30.24.   Per test claim, copay for 90 days supply is $0

## 2021-08-05 NOTE — Telephone Encounter (Signed)
Patient Advocate Encounter   Received notification from patient's pharmacy that prior authorization for Dexcom G6 Sensors is required by his/her insurance [employer].   PA submitted on 08/05/21  Key#: [id number]  Status is pending    [facility] will continue to follow:  Patient Advocate Fax: [phone]

## 2021-08-06 NOTE — Telephone Encounter (Signed)
Patient Civil engineer, contracting with Pharmacy to Process.  Patient Advocate Fax:  [phone]

## 2021-08-23 ENCOUNTER — Other Ambulatory Visit: Payer: Self-pay

## 2021-08-23 ENCOUNTER — Ambulatory Visit: Payer: Medicaid Other | Admitting: Internal Medicine

## 2021-08-23 ENCOUNTER — Encounter: Payer: Self-pay | Admitting: Internal Medicine

## 2021-08-23 VITALS — BP 132/80 | HR 99 | Ht <= 58 in | Wt 199.0 lb

## 2021-08-23 DIAGNOSIS — E1065 Type 1 diabetes mellitus with hyperglycemia: Secondary | ICD-10-CM

## 2021-08-23 LAB — POCT GLYCOSYLATED HEMOGLOBIN (HGB A1C): Hemoglobin A1C: 7.6 % — AB (ref 4.0–5.6)

## 2021-08-23 MED ORDER — OZEMPIC (0.25 OR 0.5 MG/DOSE) 2 MG/1.5ML ~~LOC~~ SOPN: 0.5000 mg | PEN_INJECTOR | SUBCUTANEOUS | 3 refills | Status: DC

## 2021-08-23 NOTE — Progress Notes (Signed)
Name: Brandy Parks  Age/ Sex: 26 y.o., female   MRN/ DOB: [id number], 03-12-1996     PCP: Mittie Bodo   Reason for Endocrinology Evaluation: Type 1 Diabetes Mellitus  Initial Endocrine Consultative Visit: 12/26/2020    PATIENT IDENTIFIER: Brandy Parks is a 26 y.o. female with a past medical history of T1DM. The patient has followed with Endocrinology clinic since 12/26/2020 for consultative assistance with management of her diabetes.  DIABETIC HISTORY:  Brandy Parks was diagnosed with DM in 2012 at age 65, has been on an insulin pump for years, initially was on medtronic subsequently switched to T-slim~ 2019. Her hemoglobin A1c has ranged from 7.2% in 2012, peaking at 12.0% in 2017. She finished cosmetology school in 2018    SUBJECTIVE:   During the last visit (03/28/2021): A1c 7.5% , continued insulin pump and increase Trulicity  Today (9/37/3428): Brandy Parks is here for a follow up on diabetes.  She checks her blood sugars multiple  times daily, has been out of the transmitter for the past week and a half . The patient has not  had hypoglycemic episodes since the last clinic visit   She has noted with nausea while on Trulicity  Denies vomiting or diarrhea   Pump and meter download:   T:slim     Basal rate I: C ratio SF BG target  0000 1.670 15 50 150  0300 1.200 15 50 150  0600 1.800 8 50 120  0800 1.850 8 35 120  1000 2.00 7.5 35 120  1700 1.900 6.5 35 120  2100 1.950 7 35 120                       Type & Model of Pump: T-slim  Insulin Type: Currently using Novolog .   Body mass index is 40.76 kg/m.   PUMP STATISTICS: Average BG: 172 Average Daily Carbs (g): 125 Average Total Daily Insulin: 67.83 Average Daily Basal: 42.48 (63% %) Average Daily Bolus: 27 (39 %)           CONTINUOUS GLUCOSE MONITORING RECORD INTERPRETATION    Dates of Recording: 1/30-2/06/2022  Sensor description:dexcom  Results statistics:   CGM use % of  time 50  Average and SD 191/76  Time in range  52      %  % Time Above 180 26  % Time above 250 21  % Time Below target 1     Glycemic patterns summary: Bg's trend down overnight and high during the day   Hyperglycemic episodes  postprandial  Hypoglycemic episodes occurred overnight   Overnight periods: trends down        HOME DIABETES REGIMEN:  Novolog  Trulicity 7.68 mg weekly   Statin: no ACE-I/ARB: yes     DIABETIC COMPLICATIONS: Microvascular complications:   Denies: CKD, retinopathy, neuropathy Last Eye Exam: Completed 07/19/2021  Macrovascular complications:   Denies: CAD, CVA, PVD   HISTORY:  Past Medical History:  Past Medical History:  Diagnosis Date   Acanthosis nigricans, acquired    Diabetes mellitus    Hypertension    Obesity    Past Surgical History: No past surgical history on file. Social History:  reports that she has never smoked. She has never used smokeless tobacco. She reports that she does not drink alcohol and does not use drugs. Family History:  Family History  Problem Relation Age of Onset   Obesity Mother    Hypothyroidism Mother  Hypertension Mother    Obesity Maternal Aunt    Hypertension Maternal Aunt    Obesity Maternal Grandmother    Hypertension Maternal Grandmother    Kidney disease Maternal Grandmother    Heart disease Maternal Grandfather    Hypertension Maternal Grandfather    Cancer Paternal Grandfather    Diabetes Maternal Uncle    Healthy Father      HOME MEDICATIONS: Allergies as of 08/23/2021   No Known Allergies      Medication List        Accurate as of August 23, 2021  2:35 PM. If you have any questions, ask your nurse or doctor.          Accu-Chek FastClix Lancets Misc 1 each by Does not apply route as directed. Check sugar 6 x daily   Cetirizine HCl 10 MG Caps Take 1 capsule (10 mg total) by mouth daily for 10 days.   Dexcom G6 Sensor Misc CHANGE SENSOR EVERY 10 DAYS    Dexcom G6 Transmitter Misc 1 kit by Does not apply route every 3 (three) months.   fluticasone 50 MCG/ACT nasal spray Commonly known as: FLONASE Place 1-2 sprays into both nostrils daily for 7 days.   glucagon 1 MG injection Use for Severe Hypoglycemia . Inject 60m intramuscularly if unresponsive, unable to swallow, unconscious and/or has seizure   glucose blood test strip Up to 6 checks per day   ibuprofen 800 MG tablet Commonly known as: ADVIL Take 1 tablet (800 mg total) by mouth every 8 (eight) hours as needed.   insulin aspart 100 UNIT/ML injection Commonly known as: NovoLOG Max Daily 100 units   lisinopril 5 MG tablet Commonly known as: ZESTRIL Take by mouth.   lisinopril 2.5 MG tablet Commonly known as: ZESTRIL TAKE 1 TABLET BY MOUTH ONCE DAILY   Trulicity 1.5 MXT/0.2IOSopn Generic drug: Dulaglutide Inject 1.5 mg into the skin once a week.   Vienva 0.1-20 MG-MCG tablet Generic drug: levonorgestrel-ethinyl estradiol TK 1 T PO QD         OBJECTIVE:   Vital Signs: BP 132/80 (BP Location: Left Arm, Patient Position: Sitting, Cuff Size: Small)    Pulse 99    Ht 4' 10" (1.473 m)    Wt 199 lb (90.3 kg)    SpO2 96%    BMI 41.59 kg/m   Wt Readings from Last 3 Encounters:  08/23/21 199 lb (90.3 kg)  03/28/21 190 lb 9.6 oz (86.5 kg)  12/26/20 195 lb (88.5 kg)     Exam: General: Pt appears well and is in NAD  Lungs: Clear with good BS bilat with no rales, rhonchi, or wheezes  Heart: RRR with normal S1 and S2 and no gallops; no murmurs; no rub  Abdomen: Normoactive bowel sounds, soft, nontender, without masses or organomegaly palpable  Extremities: No pretibial edema.  Neuro: MS is good with appropriate affect, pt is alert and Ox3     DM foot exam: 12/26/2020  The skin of the feet is intact without sores or ulcerations. The pedal pulses are 2+ on right and 2+ on left. The sensation is intact to a screening 5.07, 10 gram monofilament bilaterally       DATA REVIEWED:  Lab Results  Component Value Date   HGBA1C 7.6 (A) 08/23/2021   HGBA1C 7.5 (A) 03/28/2021   HGBA1C 9.2 (A) 09/26/2020   Lab Results  Component Value Date   MICROALBUR 0.5 04/13/2018   LDLCALC 82 04/13/2018   CREATININE 0.79 02/17/2019  Lab Results  Component Value Date   MICRALBCREAT 2 04/13/2018     Lab Results  Component Value Date   CHOL 147 04/13/2018   HDL 49 04/13/2018   LDLCALC 82 04/13/2018   TRIG 82 04/13/2018   CHOLHDL 3.0 04/13/2018         ASSESSMENT / PLAN / RECOMMENDATIONS:   1) Type 1 Diabetes Mellitus, with insulin resistance, Sub-Optimally controlled, With out complications - Most recent A1c of 7.6 %. Goal A1c < 7.0 %.    -Intolerant to Trulicity , will start Ozempic  - Will reduce basal rate at night and increase I:C ratio during the day  -Patient will return for lab work as she had worked to go to  MEDICATIONS: Stop Trulicity  Start Ozempic 0.25 mg weekly, may increase to 0.5 mg weekly after 6 weeks if no side effects         Basal rate I: C ratio SF BG target  0000 1.670 15 50 150  0300 1.100 15 50 150  0600 1.700 8 50 120  0800 1.850 8 35 120  1000 2.00 6.5 35 120  1700 1.900 5.5 35 120  2100 1.950 5.5 35 120                   EDUCATION / INSTRUCTIONS: BG monitoring instructions: Patient is instructed to check her blood sugars 3 times a day, before meals . Call [facility] if: BG persistently < 70  I reviewed the Rule of 15 for the treatment of hypoglycemia in detail with the patient. Literature supplied.    2) Diabetic complications:  Eye: Does not have known diabetic retinopathy.  Neuro/ Feet: Does not have known diabetic peripheral neuropathy .  Renal: Patient does not have known baseline CKD. She   is  on an ACEI/ARB at present.      F/U in 4 months   Signed electronically by: Mack Guise, MD  [facility] Endocrin[facility]  [facility] [address], [place] [address] Phone: [phone] FAX: [phone]   CC: Mittie Bodo [address] [place] [address] Phone: [phone]  Fax: [phone]  Return to Endocrinology clinic as below: No future appointments.

## 2021-08-23 NOTE — Patient Instructions (Addendum)
-  Stop trulicity  - Start Ozempic 0.25 mg weekly for 6 weeks, then you mau increase to 0.5 mg weekly        Basal rate I: C ratio SF BG target  0000 1.670 15 50 150  0300 1.100 15 50 150  0600 1.700 8 50 120  0800 1.850 8 35 120  1000 2.00 6.5 35 120  1700 1.900 5.5 35 120  2100 1.950 5.5 35 120                 HOW TO TREAT LOW BLOOD SUGARS (Blood sugar LESS THAN 70 MG/DL) Please follow the RULE OF 15 for the treatment of hypoglycemia treatment (when your (blood sugars are less than 70 mg/dL)   STEP 1: Take 15 grams of carbohydrates when your blood sugar is low, which includes:  3-4 GLUCOSE TABS  OR 3-4 OZ OF JUICE OR REGULAR SODA OR ONE TUBE OF GLUCOSE GEL    STEP 2: RECHECK blood sugar in 15 MINUTES STEP 3: If your blood sugar is still low at the 15 minute recheck --> then, go back to STEP 1 and treat AGAIN with another 15 grams of carbohydrates.

## 2021-08-26 ENCOUNTER — Encounter: Payer: Self-pay | Admitting: Internal Medicine

## 2021-10-02 ENCOUNTER — Other Ambulatory Visit: Payer: Self-pay | Admitting: Internal Medicine

## 2021-10-02 DIAGNOSIS — E1065 Type 1 diabetes mellitus with hyperglycemia: Secondary | ICD-10-CM

## 2021-10-02 DIAGNOSIS — R7309 Other abnormal glucose: Secondary | ICD-10-CM

## 2021-10-02 DIAGNOSIS — R739 Hyperglycemia, unspecified: Secondary | ICD-10-CM

## 2021-12-09 ENCOUNTER — Encounter: Payer: Self-pay | Admitting: Internal Medicine

## 2021-12-10 ENCOUNTER — Encounter: Payer: Self-pay | Admitting: Internal Medicine

## 2022-01-23 ENCOUNTER — Encounter (HOSPITAL_BASED_OUTPATIENT_CLINIC_OR_DEPARTMENT_OTHER): Payer: Self-pay

## 2022-01-23 DIAGNOSIS — R600 Localized edema: Secondary | ICD-10-CM | POA: Diagnosis not present

## 2022-01-23 DIAGNOSIS — Z794 Long term (current) use of insulin: Secondary | ICD-10-CM | POA: Insufficient documentation

## 2022-01-23 DIAGNOSIS — E109 Type 1 diabetes mellitus without complications: Secondary | ICD-10-CM | POA: Insufficient documentation

## 2022-01-23 DIAGNOSIS — M7989 Other specified soft tissue disorders: Secondary | ICD-10-CM | POA: Diagnosis present

## 2022-01-23 LAB — CBG MONITORING, ED: Glucose-Capillary: 127 mg/dL — ABNORMAL HIGH (ref 70–99)

## 2022-01-23 NOTE — ED Triage Notes (Signed)
Pt was dx with URI/virus last week Body aches were improving but now have become worse. States BS was 350 this am BS 127 in triage

## 2022-01-24 ENCOUNTER — Emergency Department (HOSPITAL_BASED_OUTPATIENT_CLINIC_OR_DEPARTMENT_OTHER)
Admission: EM | Admit: 2022-01-24 | Discharge: 2022-01-24 | Disposition: A | Discharge status: Home / Self Care | Payer: Medicaid Other | Attending: Emergency Medicine | Admitting: Emergency Medicine

## 2022-01-24 DIAGNOSIS — R600 Localized edema: Secondary | ICD-10-CM

## 2022-01-24 LAB — URINALYSIS, ROUTINE W REFLEX MICROSCOPIC
Bilirubin Urine: NEGATIVE
Glucose, UA: >1000 mg/dL — AB
Hgb urine dipstick: NEGATIVE
Ketones, ur: NEGATIVE mg/dL
Leukocytes,Ua: NEGATIVE
Nitrite: NEGATIVE
Protein, ur: NEGATIVE mg/dL
Specific Gravity, Urine: 1.026 (ref 1.005–1.030)
pH: 6 (ref 5.0–8.0)

## 2022-01-24 LAB — CBC WITH DIFFERENTIAL/PLATELET
Abs Immature Granulocytes: 0.04 10*3/uL (ref 0.00–0.07)
Basophils Absolute: 0.1 10*3/uL (ref 0.0–0.1)
Basophils Relative: 1 %
Eosinophils Absolute: 0.1 10*3/uL (ref 0.0–0.5)
Eosinophils Relative: 1 %
HCT: 37 % (ref 36.0–46.0)
Hemoglobin: 11.5 g/dL — ABNORMAL LOW (ref 12.0–15.0)
Immature Granulocytes: 0 %
Lymphocytes Relative: 40 %
Lymphs Abs: 4.2 10*3/uL — ABNORMAL HIGH (ref 0.7–4.0)
MCH: 24.4 pg — ABNORMAL LOW (ref 26.0–34.0)
MCHC: 31.1 g/dL (ref 30.0–36.0)
MCV: 78.4 fL — ABNORMAL LOW (ref 80.0–100.0)
Monocytes Absolute: 0.4 10*3/uL (ref 0.1–1.0)
Monocytes Relative: 4 %
Neutro Abs: 5.7 10*3/uL (ref 1.7–7.7)
Neutrophils Relative %: 54 %
Platelets: 435 10*3/uL — ABNORMAL HIGH (ref 150–400)
RBC: 4.72 MIL/uL (ref 3.87–5.11)
RDW: 14.8 % (ref 11.5–15.5)
WBC: 10.5 10*3/uL (ref 4.0–10.5)
nRBC: 0 % (ref 0.0–0.2)

## 2022-01-24 LAB — BASIC METABOLIC PANEL
Anion gap: 7 (ref 5–15)
BUN: 12 mg/dL (ref 6–20)
CO2: 27 mmol/L (ref 22–32)
Calcium: 9.8 mg/dL (ref 8.9–10.3)
Chloride: 99 mmol/L (ref 98–111)
Creatinine, Ser: 0.82 mg/dL (ref 0.44–1.00)
GFR, Estimated: >60 mL/min (ref >60)
Glucose, Bld: 341 mg/dL — ABNORMAL HIGH (ref 70–99)
Potassium: 4.2 mmol/L (ref 3.5–5.1)
Sodium: 133 mmol/L — ABNORMAL LOW (ref 135–145)

## 2022-01-24 LAB — PREGNANCY, URINE: Preg Test, Ur: NEGATIVE

## 2022-01-24 MED ORDER — HYDROCHLOROTHIAZIDE 25 MG PO TABS: 25.0000 mg | ORAL_TABLET | Freq: Every day | ORAL | 0 refills | Status: AC | PRN

## 2022-01-24 NOTE — Discharge Instructions (Addendum)
Keep your legs elevated is much as possible if they begin to swell.  Begin taking hydrochlorothiazide as prescribed as needed for persistent leg swelling.  Follow-up with your primary doctor in the next week, and return to the ER if symptoms significantly worsen or change.

## 2022-01-24 NOTE — ED Provider Notes (Signed)
[facility] EMERGENCY DEPT Provider Note   CSN: [id number] Arrival date & time: 01/23/22  2111     History  Chief Complaint  Patient presents with   Generalized Body Aches    Brandy Parks is a 26 y.o. female.  Patient is a 26 year old female with past medical history of type 1 diabetes with an insulin pump.  Patient presenting today with complaints of feet swelling, body aches, elevated blood sugar to 350, and feeling generally unwell.  She was diagnosed with a viral upper respiratory infection last week.  She seems to be getting over this, now she feels worse.  She denies any productive cough, chest pain, or difficulty breathing.  She denies fevers or chills.  She denies being on her feet for extended periods of time throughout the day.  The history is provided by the patient.       Home Medications Prior to Admission medications   Medication Sig Start Date End Date Taking? Authorizing Provider  ACCU-CHEK FASTCLIX LANCETS MISC 1 each by Does not apply route as directed. Check sugar 6 x daily 06/23/13   Lelon Huh, MD  Cetirizine HCl 10 MG CAPS Take 1 capsule (10 mg total) by mouth daily for 10 days. 05/21/18 10/14/18  Wieters, Hallie C, PA-C  Continuous Blood Gluc Sensor (DEXCOM G6 SENSOR) MISC CHANGE SENSOR EVERY 10 DAYS 10/02/21   Shamleffer, Melanie Crazier, MD  Continuous Blood Gluc Transmit (DEXCOM G6 TRANSMITTER) MISC 1 kit by Does not apply route every 3 (three) months. 03/28/21   Shamleffer, Melanie Crazier, MD  fluticasone (FLONASE) 50 MCG/ACT nasal spray Place 1-2 sprays into both nostrils daily for 7 days. 05/21/18 10/14/18  Wieters, Hallie C, PA-C  glucagon 1 MG injection Use for Severe Hypoglycemia . Inject 1mg  intramuscularly if unresponsive, unable to swallow, unconscious and/or has seizure 04/28/19   Hermenia Bers, NP  glucose blood test strip Up to 6 checks per day 09/26/20   Hermenia Bers, NP  ibuprofen (ADVIL) 800 MG tablet Take 1 tablet (800  mg total) by mouth every 8 (eight) hours as needed. 04/03/19   Sharion Balloon, NP  insulin aspart (NOVOLOG) 100 UNIT/ML injection Max Daily 100 units 12/26/20   Shamleffer, Melanie Crazier, MD  lisinopril (ZESTRIL) 2.5 MG tablet TAKE 1 TABLET BY MOUTH ONCE DAILY 11/14/20   Hermenia Bers, NP  lisinopril (ZESTRIL) 5 MG tablet Take by mouth. 10/18/19   [provider]  Semaglutide,0.25 or 0.5MG /DOS, (OZEMPIC, 0.25 OR 0.5 MG/DOSE,) 2 MG/1.5ML SOPN Inject 0.5 mg into the skin once a week. 08/23/21   Shamleffer, Melanie Crazier, MD  VIENVA 0.1-20 MG-MCG tablet TK 1 T PO QD 04/10/19   [provider]      Allergies    Patient has no known allergies.    Review of Systems   Review of Systems  All other systems reviewed and are negative.   Physical Exam Updated Vital Signs BP (!) 130/93 (BP Location: Right Arm)   Pulse 81   Temp 99.6 F (37.6 C)   Resp 20   Ht 4\' 10"  (1.473 m)   Wt 90.7 kg   LMP 12/19/2021 (Exact Date)   SpO2 100%   BMI 41.80 kg/m  Physical Exam Vitals and nursing note reviewed.  Constitutional:      General: She is not in acute distress.    Appearance: She is well-developed. She is not diaphoretic.  HENT:     Head: Normocephalic and atraumatic.  Cardiovascular:     Rate and  Rhythm: Normal rate and regular rhythm.     Heart sounds: No murmur heard.    No friction rub. No gallop.  Pulmonary:     Effort: Pulmonary effort is normal. No respiratory distress.     Breath sounds: Normal breath sounds. No wheezing.  Abdominal:     General: Bowel sounds are normal. There is no distension.     Palpations: Abdomen is soft.     Tenderness: There is no abdominal tenderness.  Musculoskeletal:        General: Normal range of motion.     Cervical back: Normal range of motion and neck supple.     Right lower leg: Edema present.     Left lower leg: Edema present.     Comments: There is trace nonpitting edema of both lower extremities.  There is no calf  tenderness.  Bevelyn Buckles' sign is absent.  Skin:    General: Skin is warm and dry.  Neurological:     General: No focal deficit present.     Mental Status: She is alert and oriented to person, place, and time.     ED Results / Procedures / Treatments   Labs (all labs ordered are listed, but only abnormal results are displayed) Labs Reviewed  CBG MONITORING, ED - Abnormal; Notable for the following components:      Result Value   Glucose-Capillary 127 (*)    All other components within normal limits    EKG None  Radiology No results found.  Procedures Procedures    Medications Ordered in ED Medications - No data to display  ED Course/ Medical Decision Making/ A&P  Patient presenting here with complaints of feeling generally unwell and having leg swelling.  I am uncertain as to the etiology of this, but her electrolytes and laboratory studies are basically unremarkable.  Her sugar is 341, however she has not had her evening insulin.  There is no sign of DKA.  At this point, I feel that the patient can safely be discharged.  I have advised her to keep her feet propped up and will prescribe a small quantity of hydrochlorothiazide for her foot swelling.  To return as needed for any problems.  Final Clinical Impression(s) / ED Diagnoses Final diagnoses:  None    Rx / DC Orders ED Discharge Orders     None         Veryl Speak, MD 01/24/22 [phone]

## 2022-02-03 ENCOUNTER — Other Ambulatory Visit: Payer: Self-pay | Admitting: Internal Medicine

## 2022-02-12 ENCOUNTER — Ambulatory Visit (HOSPITAL_COMMUNITY)
Admission: RE | Admit: 2022-02-12 | Discharge: 2022-02-12 | Disposition: A | Discharge status: Home / Self Care | Payer: Medicaid Other | Source: Ambulatory Visit | Attending: Emergency Medicine | Admitting: Emergency Medicine

## 2022-02-12 ENCOUNTER — Encounter (HOSPITAL_COMMUNITY): Payer: Self-pay

## 2022-02-12 VITALS — BP 125/91 | HR 111 | Temp 99.1°F | Resp 14

## 2022-02-12 DIAGNOSIS — Z20828 Contact with and (suspected) exposure to other viral communicable diseases: Secondary | ICD-10-CM | POA: Diagnosis present

## 2022-02-12 DIAGNOSIS — N949 Unspecified condition associated with female genital organs and menstrual cycle: Secondary | ICD-10-CM | POA: Diagnosis present

## 2022-02-12 DIAGNOSIS — B3731 Acute candidiasis of vulva and vagina: Secondary | ICD-10-CM

## 2022-02-12 MED ORDER — VALACYCLOVIR HCL 1 G PO TABS: 1000.0000 mg | ORAL_TABLET | Freq: Two times a day (BID) | ORAL | 0 refills | Status: AC

## 2022-02-12 MED ORDER — FLUCONAZOLE 150 MG PO TABS: ORAL_TABLET | ORAL | 0 refills | Status: DC

## 2022-02-12 NOTE — ED Provider Notes (Signed)
[place]    CSN: [id number] Arrival date & time: 02/12/22  0808    HISTORY   Chief Complaint  Patient presents with   Exposure to STD    Entered by patient   HPI Brandy Parks is a pleasant, 26 y.o. female who presents to urgent care today. Pt presents with c/o exposure to herpes. Pt states she has noticed a few bumps on the anterior aspect of her right labia majora, states she attempted to squeeze them and pop them, states they are mildly painful.  Patient states she has been with a partner for the past 2 months, states he advised her last week that he has genital herpes, states she is unsure whether or not he is taking suppressive therapy at this time.  Patient also states that she has noticed that she is has some vaginal itching and discomfort for the past 2 weeks.  The history is provided by the patient.   Past Medical History:  Diagnosis Date   Acanthosis nigricans, acquired    Diabetes mellitus    Hypertension    Obesity    Patient Active Problem List   Diagnosis Date Noted   Acanthosis nigricans, acquired 08/09/2019   Hyperglycemia 04/13/2018   Elevated hemoglobin A1c 04/13/2018   Benign essential hypertension 08/30/2017   Hypertension associated with type 1 diabetes mellitus ([place]) 08/30/2017   Maladaptive health behaviors affecting medical condition 09/25/2015   Type I diabetes mellitus, uncontrolled 01/10/2015   Skin abscess 12/06/2014   Insulin pump titration 02/08/2014   Obesity 10/30/2010   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 No pertinent surgical history. OB History   No obstetric history on file.    Home Medications    Prior to Admission medications   Medication Sig Start Date End Date Taking? Authorizing Provider  ACCU-CHEK FASTCLIX LANCETS MISC 1 each by Does not apply route as directed. Check sugar 6 x daily 06/23/13   Lelon Huh, MD  Cetirizine HCl 10 MG CAPS Take 1 capsule (10 mg total) by mouth daily for 10 days. 05/21/18 10/14/18  Wieters, Hallie C, PA-C  Continuous Blood Gluc Sensor (DEXCOM G6 SENSOR) MISC CHANGE SENSOR EVERY 10 DAYS 10/02/21   Shamleffer, Melanie Crazier, MD  Continuous Blood Gluc Transmit (DEXCOM G6 TRANSMITTER) MISC 1 kit by Does not apply route every 3 (three) months. 03/28/21   Shamleffer, Melanie Crazier, MD  fluticasone (FLONASE) 50 MCG/ACT nasal spray Place 1-2 sprays into both nostrils daily for 7 days. 05/21/18 10/14/18  Wieters, Hallie C, PA-C  glucagon 1 MG injection Use for Severe Hypoglycemia . Inject 38m intramuscularly if unresponsive, unable to swallow, unconscious and/or has seizure 04/28/19   BHermenia Bers NP  glucose blood test strip Up to 6 checks per day 09/26/20   BHermenia Bers NP  hydrochlorothiazide (HYDRODIURIL) 25 MG tablet Take 1 tablet (25 mg total) by mouth daily as needed. 01/24/22   DVeryl Speak MD  ibuprofen (ADVIL) 800 MG tablet Take 1 tablet (800 mg total) by mouth every 8 (eight) hours as needed. 04/03/19   TSharion Balloon NP  lisinopril (ZESTRIL) 2.5 MG tablet TAKE 1 TABLET BY MOUTH ONCE DAILY 11/14/20   BHermenia Bers NP  lisinopril (ZESTRIL) 5 MG tablet Take by mouth. 10/18/19   [provider]  NOVOLOG 100 UNIT/ML injection MAXIMUM DAILY 100 UNITS AS DIRECTED 02/03/22   Shamleffer, IMelanie Crazier MD  Semaglutide,0.25  or 0.5MG/DOS, (OZEMPIC, 0.25 OR 0.5 MG/DOSE,) 2 MG/1.5ML SOPN Inject 0.5 mg into the skin once a week. Patient not taking: Reported on 02/12/2022 08/23/21   Shamleffer, Melanie Crazier, MD  VIENVA 0.1-20 MG-MCG tablet TK 1 T PO QD 04/10/19   [provider]    Family History Family History  Problem Relation Age of Onset   Obesity Mother    Hypothyroidism Mother    Hypertension Mother    Obesity Maternal Aunt    Hypertension Maternal Aunt    Obesity Maternal Grandmother    Hypertension Maternal Grandmother    Kidney  disease Maternal Grandmother    Heart disease Maternal Grandfather    Hypertension Maternal Grandfather    Cancer Paternal Grandfather    Diabetes Maternal Uncle    Healthy Father    Social History Social History   Tobacco Use   Smoking status: Never   Smokeless tobacco: Never  Substance Use Topics   Alcohol use: No   Drug use: No   Allergies   Patient has no known allergies.  Review of Systems Review of Systems Pertinent findings revealed after performing a 14 point review of systems has been noted in the history of present illness.  Physical Exam Triage Vital Signs ED Triage Vitals  Enc Vitals Group     BP 05/03/21 0827 (!) 147/82     Pulse Rate 05/03/21 0827 72     Resp 05/03/21 0827 18     Temp 05/03/21 0827 98.3 F (36.8 C)     Temp Source 05/03/21 0827 Oral     SpO2 05/03/21 0827 98 %     Weight --      Height --      Head Circumference --      Peak Flow --      Pain Score 05/03/21 0826 5     Pain Loc --      Pain Edu? --      Excl. in [place]? --   No data found.  Updated Vital Signs BP (!) 125/91 (BP Location: Right Arm)   Pulse (!) 111   Temp 99.1 F (37.3 C) (Oral)   Resp 14   LMP 02/03/2022 (Approximate)   SpO2 98%   Physical Exam Vitals and nursing note reviewed.  Constitutional:      General: She is not in acute distress.    Appearance: Normal appearance. She is not ill-appearing.  HENT:     Head: Normocephalic and atraumatic.  Eyes:     General: Lids are normal.        Right eye: No discharge.        Left eye: No discharge.     Extraocular Movements: Extraocular movements intact.     Conjunctiva/sclera: Conjunctivae normal.     Right eye: Right conjunctiva is not injected.     Left eye: Left conjunctiva is not injected.  Neck:     Trachea: Trachea and phonation normal.  Cardiovascular:     Rate and Rhythm: Normal rate and regular rhythm.     Pulses: Normal pulses.     Heart sounds: Normal heart sounds. No murmur heard.    No friction  rub. No gallop.  Pulmonary:     Effort: Pulmonary effort is normal. No accessory muscle usage, prolonged expiration or respiratory distress.     Breath sounds: Normal breath sounds. No stridor, decreased air movement or transmitted upper airway sounds. No decreased breath sounds, wheezing, rhonchi or rales.  Chest:  Chest wall: No tenderness.  Genitourinary:    Labia:        Right: Lesion (Viral culture obtained) present.      Vagina: Vaginal discharge present.    Musculoskeletal:        General: Normal range of motion.     Cervical back: Normal range of motion and neck supple. Normal range of motion.  Lymphadenopathy:     Cervical: No cervical adenopathy.  Skin:    General: Skin is warm and dry.     Findings: No erythema or rash.  Neurological:     General: No focal deficit present.     Mental Status: She is alert and oriented to person, place, and time.  Psychiatric:        Mood and Affect: Mood normal.        Behavior: Behavior normal.     Visual Acuity Right Eye Distance:   Left Eye Distance:   Bilateral Distance:    Right Eye Near:   Left Eye Near:    Bilateral Near:     UC Couse / Diagnostics / Procedures:     Radiology No results found.  Procedures Procedures (including critical care time) EKG  Pending results:  Labs Reviewed  HSV CULTURE AND TYPING    Medications Ordered in UC: Medications - No data to display  UC Diagnoses / Final Clinical Impressions(s)   I have reviewed the triage vital signs and the nursing notes.  Pertinent labs & imaging results that were available during my care of the patient were reviewed by me and considered in my medical decision making (see chart for details).    Final diagnoses:  Exposure to herpes simplex virus (HSV)  Genital lesion, female  Vaginal candida    Will await results of HSV culture and advise patient of results once received.  Patient advised to begin valacyclovir now for presumed HSV lesion.   Patient further advised that if positive she should reach out to her primary care provider to discuss suppressive therapy.  Patient was provided with prescription for Diflucan for likely yeast infection given physical exam findings.  Return precautions advised.   ED Prescriptions     Medication Sig Dispense Auth. Provider   valACYclovir (VALTREX) 1000 MG tablet Take 1 tablet (1,000 mg total) by mouth 2 (two) times daily for 10 days. 20 tablet Lynden Oxford Scales, PA-C   fluconazole (DIFLUCAN) 150 MG tablet Take 1 tablet today.  Take second tablet 3 days later. 2 tablet Lynden Oxford Scales, PA-C      PDMP not reviewed this encounter.  Disposition Upon Discharge:  Condition: stable for discharge home  Patient presented with concern for an acute illness with associated systemic symptoms and significant discomfort requiring urgent management. In my opinion, this is a condition that a prudent lay person (someone who possesses an average knowledge of health and medicine) may potentially expect to result in complications if not addressed urgently such as respiratory distress, impairment of bodily function or dysfunction of bodily organs.   As such, the patient has been evaluated and assessed, work-up was performed and treatment was provided in alignment with urgent care protocols and evidence based medicine.  Patient/parent/caregiver has been advised that the patient may require follow up for further testing and/or treatment if the symptoms continue in spite of treatment, as clinically indicated and appropriate.  Routine symptom specific, illness specific and/or disease specific instructions were discussed with the patient and/or caregiver at length.  Prevention strategies for avoiding STD  exposure were also discussed.  The patient will follow up with their current PCP if and as advised. If the patient does not currently have a PCP we will assist them in obtaining one.   The patient may need  specialty follow up if the symptoms continue, in spite of conservative treatment and management, for further workup, evaluation, consultation and treatment as clinically indicated and appropriate.  Patient/parent/caregiver verbalized understanding and agreement of plan as discussed.  All questions were addressed during visit.  Please see discharge instructions below for further details of plan.  Discharge Instructions:   Discharge Instructions      The result of the herpes culture which was obtained during your visit today will be made available to your MyChart account within the next 3 to 5 days.  If there is a positive result, you will be contacted by phone with further instructions regarding treatment and prevention with daily suppressive antiviral medication.   Please abstain from sexual intercourse of any kind, vaginal, oral or anal, until you have received the results of your herpes culture.    At this time I recommend that you begin taking valacyclovir, 1 tablet twice daily for 10 days.  Results of your herpes culture is negative you can discontinue it.  If your result is positive, please reach out to your regular provider to discuss daily suppressive therapy, typically 1 g of valacyclovir daily.  Based on the symptoms and concerns you shared with me today, you were treated for presumed vaginal yeast infection with fluconazole (Diflucan), take the first tablet today and take the second tablet three days after the first tablet.  I recommend that you abstain from sexual intercourse, tampon use or any other other intravaginal activities while being treated.  If you have not had complete resolution of your symptoms after completing treatment, please return for repeat evaluation.   Thank you for visiting urgent care today.  I appreciate the opportunity to participate in your care.       This office note has been dictated using Museum/gallery curator.  Unfortunately, this method  of dictation can sometimes lead to typographical or grammatical errors.  I apologize for your inconvenience in advance if this occurs.  Please do not hesitate to reach out to me if clarification is needed.       Lynden Oxford Scales, PA-C 02/12/22 0900

## 2022-02-12 NOTE — Discharge Instructions (Signed)
The result of the herpes culture which was obtained during your visit today will be made available to your MyChart account within the next 3 to 5 days.  If there is a positive result, you will be contacted by phone with further instructions regarding treatment and prevention with daily suppressive antiviral medication.   Please abstain from sexual intercourse of any kind, vaginal, oral or anal, until you have received the results of your herpes culture.    At this time I recommend that you begin taking valacyclovir, 1 tablet twice daily for 10 days.  Results of your herpes culture is negative you can discontinue it.  If your result is positive, please reach out to your regular provider to discuss daily suppressive therapy, typically 1 g of valacyclovir daily.  Based on the symptoms and concerns you shared with me today, you were treated for presumed vaginal yeast infection with fluconazole (Diflucan), take the first tablet today and take the second tablet three days after the first tablet.  I recommend that you abstain from sexual intercourse, tampon use or any other other intravaginal activities while being treated.  If you have not had complete resolution of your symptoms after completing treatment, please return for repeat evaluation.   Thank you for visiting urgent care today.  I appreciate the opportunity to participate in your care.

## 2022-02-12 NOTE — ED Triage Notes (Signed)
Pt presents with c/o exposure to herpes. Pt states she has noticed a few bumps around her vaginal area.

## 2022-02-13 LAB — HSV 1/2 PCR (SURFACE)
HSV-1 DNA: NOT DETECTED
HSV-2 DNA: DETECTED — AB

## 2022-03-04 ENCOUNTER — Other Ambulatory Visit: Payer: Self-pay | Admitting: Internal Medicine

## 2022-03-04 DIAGNOSIS — E1065 Type 1 diabetes mellitus with hyperglycemia: Secondary | ICD-10-CM

## 2022-03-04 DIAGNOSIS — R739 Hyperglycemia, unspecified: Secondary | ICD-10-CM

## 2022-03-04 DIAGNOSIS — R7309 Other abnormal glucose: Secondary | ICD-10-CM

## 2022-03-24 ENCOUNTER — Ambulatory Visit (INDEPENDENT_AMBULATORY_CARE_PROVIDER_SITE_OTHER): Payer: Medicaid Other | Admitting: Internal Medicine

## 2022-03-24 ENCOUNTER — Encounter: Payer: Self-pay | Admitting: Internal Medicine

## 2022-03-24 VITALS — BP 124/80 | HR 100 | Ht <= 58 in | Wt 198.0 lb

## 2022-03-24 DIAGNOSIS — E1065 Type 1 diabetes mellitus with hyperglycemia: Secondary | ICD-10-CM

## 2022-03-24 NOTE — Patient Instructions (Signed)
Basal rate I: C ratio SF BG target  0000 1.670 15 50 150  0300 1.100 15 50 150  0600 1.700 8 50 120  0800 2.0 6.5 30 120  1000 2.1 6.5 30 120  1700 2.0 5.5 30 120  2100 2.0 5.5 30 120                 HOW TO TREAT LOW BLOOD SUGARS (Blood sugar LESS THAN 70 MG/DL) Please follow the RULE OF 15 for the treatment of hypoglycemia treatment (when your (blood sugars are less than 70 mg/dL)   STEP 1: Take 15 grams of carbohydrates when your blood sugar is low, which includes:  3-4 GLUCOSE TABS  OR 3-4 OZ OF JUICE OR REGULAR SODA OR ONE TUBE OF GLUCOSE GEL    STEP 2: RECHECK blood sugar in 15 MINUTES STEP 3: If your blood sugar is still low at the 15 minute recheck --> then, go back to STEP 1 and treat AGAIN with another 15 grams of carbohydrates.

## 2022-03-24 NOTE — Progress Notes (Signed)
Name: Brandy Parks  Age/ Sex: 26 y.o., female   MRN/ DOB: [id number], Jul 07, 1996     PCP: Mittie Bodo   Reason for Endocrinology Evaluation: Type 1 Diabetes Mellitus  Initial Endocrine Consultative Visit: 12/26/2020    PATIENT IDENTIFIER: Brandy Parks is a 25 y.o. female with a past medical history of T1DM. The patient has followed with Endocrinology clinic since 12/26/2020 for consultative assistance with management of her diabetes.  DIABETIC HISTORY:  Brandy Parks was diagnosed with DM in 2012 at age 75, has been on an insulin pump for years, initially was on medtronic subsequently switched to T-slim~ 2019. Her hemoglobin A1c has ranged from 7.2% in 2012, peaking at 12.0% in 2017. She finished cosmetology school in 2018    SUBJECTIVE:   During the last visit (08/23/2021): A1c 7.6%  Today (03/24/2022): Brandy Parks is here for a follow up on diabetes.  She checks her blood sugars multiple  times daily, has been out of the transmitter for the past week and a half . The patient has not  had hypoglycemic episodes since the last clinic visit   Pump and meter download:   T:slim     Basal rate I: C ratio SF BG target  0000 1.5 12 50 150  0300 1.100 12 50 150  0600 1.700 7 50 120  0800 1.850 7 35 120  1000 2.00 6.5 35 120  1700 1.900 5.5 35 120  2100 1.950 5.5 35 120                            Type & Model of Pump: T-slim  Insulin Type: Currently using Novolog .    PUMP STATISTICS: Average BG: 208 Average Daily Carbs (g): 128 Average Total Daily Insulin: 75.1 Average Daily Basal: 46(60 % %) Average Daily Bolus: 30  (40 %)           HOME DIABETES REGIMEN:  Novolog    Statin: no ACE-I/ARB: yes     DIABETIC COMPLICATIONS: Microvascular complications:   Denies: CKD, retinopathy, neuropathy Last Eye Exam: Completed 07/19/2021  Macrovascular complications:   Denies: CAD, CVA, PVD   HISTORY:  Past Medical History:  Past Medical  History:  Diagnosis Date   Acanthosis nigricans, acquired    Diabetes mellitus    Hypertension    Obesity    Past Surgical History: No past surgical history on file. Social History:  reports that she has never smoked. She has never used smokeless tobacco. She reports that she does not drink alcohol and does not use drugs. Family History:  Family History  Problem Relation Age of Onset   Obesity Mother    Hypothyroidism Mother    Hypertension Mother    Obesity Maternal Aunt    Hypertension Maternal Aunt    Obesity Maternal Grandmother    Hypertension Maternal Grandmother    Kidney disease Maternal Grandmother    Heart disease Maternal Grandfather    Hypertension Maternal Grandfather    Cancer Paternal Grandfather    Diabetes Maternal Uncle    Healthy Father      HOME MEDICATIONS: Allergies as of 03/24/2022   No Known Allergies      Medication List        Accurate as of March 24, 2022  3:32 PM. If you have any questions, ask your nurse or doctor.          STOP taking these medications  fluconazole 150 MG tablet Commonly known as: Diflucan Stopped by: Dorita Sciara, MD   lisinopril 2.5 MG tablet Commonly known as: ZESTRIL Stopped by: Dorita Sciara, MD   lisinopril 5 MG tablet Commonly known as: ZESTRIL Stopped by: Dorita Sciara, MD   Ozempic (0.25 or 0.5 MG/DOSE) 2 MG/1.5ML Sopn Generic drug: Semaglutide(0.25 or 0.5MG/DOS) Stopped by: Dorita Sciara, MD       TAKE these medications    Accu-Chek FastClix Lancets Misc 1 each by Does not apply route as directed. Check sugar 6 x daily   amLODipine 10 MG tablet Commonly known as: NORVASC Take 10 mg by mouth daily.   Cetirizine HCl 10 MG Caps Take 1 capsule (10 mg total) by mouth daily for 10 days.   Dexcom G6 Sensor Misc CHANGE SENSOR EVERY 10 DAYS   Dexcom G6 Transmitter Misc 1 kit by Does not apply route every 3 (three) months.   fluticasone 50 MCG/ACT nasal  spray Commonly known as: FLONASE Place 1-2 sprays into both nostrils daily for 7 days.   glucagon 1 MG injection Use for Severe Hypoglycemia . Inject 74m intramuscularly if unresponsive, unable to swallow, unconscious and/or has seizure   glucose blood test strip Up to 6 checks per day   hydrochlorothiazide 25 MG tablet Commonly known as: HYDRODIURIL Take 1 tablet (25 mg total) by mouth daily as needed.   ibuprofen 800 MG tablet Commonly known as: ADVIL Take 1 tablet (800 mg total) by mouth every 8 (eight) hours as needed.   NovoLOG 100 UNIT/ML injection Generic drug: insulin aspart MAXIMUM DAILY 100 UNITS AS DIRECTED   Vienva 0.1-20 MG-MCG tablet Generic drug: levonorgestrel-ethinyl estradiol TK 1 T PO QD         OBJECTIVE:   Vital Signs: BP 124/80 (BP Location: Left Arm, Patient Position: Sitting, Cuff Size: Large)   Pulse 100   Ht _0  (1.473 m)   Wt 198 lb (89.8 kg)   SpO2 95%   BMI 41.38 kg/m   Wt Readings from Last 3 Encounters:  03/24/22 198 lb (89.8 kg)  01/23/22 200 lb (90.7 kg)  08/23/21 199 lb (90.3 kg)     Exam: General: Pt appears well and is in NAD  Lungs: Clear with good BS bilat with no rales, rhonchi, or wheezes  Heart: RRR with normal S1 and S2 and no gallops; no murmurs; no rub  Abdomen: Normoactive bowel sounds, soft, nontender, without masses or organomegaly palpable  Extremities: No pretibial edema.  Neuro: MS is good with appropriate affect, pt is alert and Ox3     DM foot exam: 03/24/2022  The skin of the feet is intact without sores or ulcerations. The pedal pulses are 2+ on right and 2+ on left. The sensation is intact to a screening 5.07, 10 gram monofilament bilaterally      DATA REVIEWED:  Lab Results  Component Value Date   HGBA1C 7.6 (A) 08/23/2021   HGBA1C 7.5 (A) 03/28/2021   HGBA1C 9.2 (A) 09/26/2020   Lab Results  Component Value Date   MICROALBUR 0.5 04/13/2018   LDLCALC 82 04/13/2018   CREATININE 0.82  01/24/2022   Lab Results  Component Value Date   MICRALBCREAT 2 04/13/2018     Lab Results  Component Value Date   CHOL 147 04/13/2018   HDL 49 04/13/2018   LDLCALC 82 04/13/2018   TRIG 82 04/13/2018   CHOLHDL 3.0 04/13/2018         ASSESSMENT / PLAN /  RECOMMENDATIONS:   1) Type 1 Diabetes Mellitus, with insulin resistance, poorly controlled, With out complications - Most recent A1c of 8.9  %. Goal A1c < 7.0 %.    -Intolerant to Trulicity , and Ozempic  - Declines on Metformin  -Her A1c has increased from 7.6% to 8.9%, I have reviewed her pump download today and it appears that she does not enter CHO during the day despite eating.  CHO entry is sporadic, I have again encouraged the patient to enter carbohydrate consumed with each meal.  I offered to refer her to our CDE for carb counting but she tells me she does know how to do it.  Today she did enter 78 g with her lunch with a 2-hour postprandial BG reading of 164 mg/DL which is acceptable -I will increase her basal rate during the day, and adjust her sensitivity factor as below     MEDICATIONS:       Basal rate I: C ratio SF BG target  0000 1.670 15 50 150  0300 1.100 15 50 150  0600 1.700 8 50 120  0800 2.0 6.5 30 120  1000 2.1 6.5 30 120  1700 2.0 5.5 30 120  2100 2.0 5.5 30 120                   EDUCATION / INSTRUCTIONS: BG monitoring instructions: Patient is instructed to check her blood sugars 3 times a day, before meals . Call [facility] if: BG persistently < 70  I reviewed the Rule of 15 for the treatment of hypoglycemia in detail with the patient. Literature supplied.    2) Diabetic complications:  Eye: Does not have known diabetic retinopathy.  Neuro/ Feet: Does not have known diabetic peripheral neuropathy .  Renal: Patient does not have known baseline CKD. She   is  on an ACEI/ARB at present.      F/U in 6 months   Signed electronically by: Mack Guise,  MD  [facility] Endocrinology  [facility] [address] [place], [place] [address] Phone: [phone] FAX: [phone]   CC: Mittie Bodo [address] [place] [address] Phone: [phone]  Fax: [phone]  Return to Endocrinology clinic as below: No future appointments.

## 2022-03-25 ENCOUNTER — Encounter: Payer: Self-pay | Admitting: Internal Medicine

## 2022-04-29 ENCOUNTER — Other Ambulatory Visit: Payer: Self-pay | Admitting: Internal Medicine

## 2022-04-29 DIAGNOSIS — E1065 Type 1 diabetes mellitus with hyperglycemia: Secondary | ICD-10-CM

## 2022-05-02 ENCOUNTER — Telehealth (INDEPENDENT_AMBULATORY_CARE_PROVIDER_SITE_OTHER): Payer: Self-pay | Admitting: Family

## 2022-05-02 NOTE — Telephone Encounter (Signed)
Dexcom Transmitter needs PA

## 2022-05-02 NOTE — Telephone Encounter (Signed)
Who's calling (name and relationship to patient) :  Best contact number: [phone]  Provider they see: Hermenia Bers  Reason for call: [place] is calling in regards to clinical notes for mutual patient that is needing diabetic supplies   Call ID: [phone]     [place]  Name of prescription:  Pharmacy:

## 2022-05-02 NOTE — Telephone Encounter (Signed)
Spoke with [facility]. Let them know patient no longer is at this practice. They have her new info.

## 2022-05-08 ENCOUNTER — Other Ambulatory Visit (HOSPITAL_COMMUNITY): Payer: Self-pay

## 2022-05-08 ENCOUNTER — Telehealth: Payer: Self-pay | Admitting: Pharmacy Technician

## 2022-05-08 NOTE — Telephone Encounter (Signed)
Pharmacy Patient Advocate Encounter   Received notification from CMA/Office that prior authorization for Dexcom Transmitter is required/requested.   PA submitted on 05/08/22 to [place] Medicaid via CoverMyMeds Key [id number] Status is pending

## 2022-05-08 NOTE — Telephone Encounter (Signed)
Pharmacy Patient Advocate Encounter   Received notification from CMA/Office [facility]) that prior authorization for Dexcom G6 Transmitter is required/requested.   PA submitted on 05/08/22 to [place] via CoverMyMeds Key  [id number]  PA Case ID: [id number] Status is pending

## 2022-05-08 NOTE — Telephone Encounter (Signed)
Pharmacy Patient Advocate Encounter  Received notification from [place] that the request for prior authorization for Dexcom Transmitter and Sensors has been denied due to the pt having a different primary ins.     Will request PA from other ins.

## 2022-05-15 ENCOUNTER — Other Ambulatory Visit (HOSPITAL_COMMUNITY): Payer: Self-pay

## 2022-05-15 NOTE — Telephone Encounter (Addendum)
Patient Advocate Encounter  Prior Authorization for [employer] was previously approved.    PA# [id number] Key: [id number]  Effective dates: 05/08/2022 through 05/09/2023  Called pharmacy and updated insurance info. Both Transmitter and sensors are able to be filled

## 2022-05-23 ENCOUNTER — Telehealth (INDEPENDENT_AMBULATORY_CARE_PROVIDER_SITE_OTHER): Payer: Self-pay | Admitting: Family

## 2022-05-23 NOTE — Telephone Encounter (Signed)
Patient is no longer with practice. Called and let [facility] know again. Spoke with Darryl.

## 2022-05-23 NOTE — Telephone Encounter (Signed)
Who's calling (name and relationship to patient) : Galve: [employer]  Best contact number: [phone]  Provider they see: Dalbert Garnet, NP  Reason for call: Galve lvm about Rx requested that was faxed over to [facility]. She stated that forms can be faxed back to: [phone].  Call ID:      PRESCRIPTION REFILL ONLY  Name of prescription:  Pharmacy:

## 2022-06-03 ENCOUNTER — Encounter: Payer: Self-pay | Admitting: Internal Medicine

## 2022-06-05 ENCOUNTER — Ambulatory Visit
Admission: RE | Admit: 2022-06-05 | Discharge: 2022-06-05 | Disposition: A | Discharge status: Home / Self Care | Payer: BC Managed Care – PPO | Source: Ambulatory Visit | Attending: Urgent Care | Admitting: Urgent Care

## 2022-06-05 VITALS — BP 153/88 | HR 102 | Temp 99.2°F | Resp 20

## 2022-06-05 DIAGNOSIS — Z79899 Other long term (current) drug therapy: Secondary | ICD-10-CM | POA: Diagnosis not present

## 2022-06-05 DIAGNOSIS — Z9641 Presence of insulin pump (external) (internal): Secondary | ICD-10-CM | POA: Diagnosis not present

## 2022-06-05 DIAGNOSIS — R053 Chronic cough: Secondary | ICD-10-CM | POA: Insufficient documentation

## 2022-06-05 DIAGNOSIS — I10 Essential (primary) hypertension: Secondary | ICD-10-CM | POA: Diagnosis not present

## 2022-06-05 DIAGNOSIS — J4530 Mild persistent asthma, uncomplicated: Secondary | ICD-10-CM | POA: Diagnosis present

## 2022-06-05 DIAGNOSIS — Z794 Long term (current) use of insulin: Secondary | ICD-10-CM | POA: Insufficient documentation

## 2022-06-05 DIAGNOSIS — Z1152 Encounter for screening for COVID-19: Secondary | ICD-10-CM | POA: Insufficient documentation

## 2022-06-05 DIAGNOSIS — B349 Viral infection, unspecified: Secondary | ICD-10-CM

## 2022-06-05 DIAGNOSIS — E109 Type 1 diabetes mellitus without complications: Secondary | ICD-10-CM | POA: Diagnosis not present

## 2022-06-05 LAB — RESP PANEL BY RT-PCR (RSV, FLU A&B, COVID)  RVPGX2
Influenza A by PCR: NEGATIVE
Influenza B by PCR: NEGATIVE
Resp Syncytial Virus by PCR: POSITIVE — AB
SARS Coronavirus 2 by RT PCR: NEGATIVE

## 2022-06-05 MED ORDER — PSEUDOEPHEDRINE HCL 60 MG PO TABS: 60.0000 mg | ORAL_TABLET | Freq: Three times a day (TID) | ORAL | 0 refills | Status: DC | PRN

## 2022-06-05 MED ORDER — CETIRIZINE HCL 10 MG PO TABS: 10.0000 mg | ORAL_TABLET | Freq: Every day | ORAL | 0 refills | Status: DC

## 2022-06-05 MED ORDER — PROMETHAZINE-DM 6.25-15 MG/5ML PO SYRP: 5.0000 mL | ORAL_SOLUTION | Freq: Three times a day (TID) | ORAL | 0 refills | Status: DC | PRN

## 2022-06-05 NOTE — ED Triage Notes (Signed)
Pt c/o cough, wheezing x 4 days-using albuterol inhaler at home-NAD-steady gait

## 2022-06-05 NOTE — Discharge Instructions (Addendum)

## 2022-06-05 NOTE — ED Provider Notes (Signed)
[facility]  Note:  This document was prepared using Systems analyst and may include unintentional dictation errors.  MRN: [id number] DOB: August 21, 1995  Subjective:   Brandy Parks is a 26 y.o. female presenting for 4-day history of acute onset persistent coughing, congestion and drainage, wheezing.  Patient has a history of asthma and has been using her albuterol inhaler with good relief.  Her primary concern is having had exposure to RSV.  She is not a smoker, no vaping.  She does have type 1 diabetes and is not controlled.  She has her insulin pump.  No high fevers or body pains.    No current facility-administered medications for this encounter.  Current Outpatient Medications:    lisinopril (ZESTRIL) 2.5 MG tablet, Take 2.5 mg by mouth daily., Disp: , Rfl:    ACCU-CHEK FASTCLIX LANCETS MISC, 1 each by Does not apply route as directed. Check sugar 6 x daily, Disp: 204 each, Rfl: 6   amLODipine (NORVASC) 10 MG tablet, Take 10 mg by mouth daily., Disp: , Rfl:    Cetirizine HCl 10 MG CAPS, Take 1 capsule (10 mg total) by mouth daily for 10 days., Disp: 10 capsule, Rfl: 0   Continuous Blood Gluc Sensor (DEXCOM G6 SENSOR) MISC, CHANGE SENSOR EVERY 10 DAYS, Disp: 3 each, Rfl: 3   Continuous Blood Gluc Transmit (DEXCOM G6 TRANSMITTER) MISC, USE AS DIRECTED, Disp: 1 each, Rfl: 3   fluticasone (FLONASE) 50 MCG/ACT nasal spray, Place 1-2 sprays into both nostrils daily for 7 days., Disp: 1 g, Rfl: 0   glucagon 1 MG injection, Use for Severe Hypoglycemia . Inject 90m intramuscularly if unresponsive, unable to swallow, unconscious and/or has seizure, Disp: 2 kit, Rfl: 2   glucose blood test strip, Up to 6 checks per day, Disp: 200 each, Rfl: 3   hydrochlorothiazide (HYDRODIURIL) 25 MG tablet, Take 1 tablet (25 mg total) by mouth daily as needed., Disp: 20 tablet, Rfl: 0   ibuprofen (ADVIL) 800 MG tablet, Take 1 tablet (800 mg total) by mouth every 8 (eight)  hours as needed., Disp: 21 tablet, Rfl: 0   NOVOLOG 100 UNIT/ML injection, MAXIMUM DAILY 100 UNITS AS DIRECTED, Disp: 90 mL, Rfl: 3   VIENVA 0.1-20 MG-MCG tablet, TK 1 T PO QD, Disp: , Rfl:    No Known Allergies  Past Medical History:  Diagnosis Date   Acanthosis nigricans, acquired    Diabetes mellitus    Hypertension    Obesity      No past surgical history on file.  Family History  Problem Relation Age of Onset   Obesity Mother    Hypothyroidism Mother    Hypertension Mother    Obesity Maternal Aunt    Hypertension Maternal Aunt    Obesity Maternal Grandmother    Hypertension Maternal Grandmother    Kidney disease Maternal Grandmother    Heart disease Maternal Grandfather    Hypertension Maternal Grandfather    Cancer Paternal Grandfather    Diabetes Maternal Uncle    Healthy Father     Social History   Tobacco Use   Smoking status: Never   Smokeless tobacco: Never  Substance Use Topics   Alcohol use: No   Drug use: No    ROS   Objective:   Vitals: BP (!) 153/88 (BP Location: Right Arm)   Pulse (!) 102   Temp 99.2 F (37.3 C) (Oral)   Resp 20   LMP 05/18/2022   SpO2 97%  Physical Exam Constitutional:      General: She is not in acute distress.    Appearance: Normal appearance. She is well-developed and normal weight. She is not ill-appearing, toxic-appearing or diaphoretic.  HENT:     Head: Normocephalic and atraumatic.     Right Ear: Tympanic membrane, ear canal and external ear normal. No drainage or tenderness. No middle ear effusion. There is no impacted cerumen. Tympanic membrane is not erythematous or bulging.     Left Ear: Tympanic membrane, ear canal and external ear normal. No drainage or tenderness.  No middle ear effusion. There is no impacted cerumen. Tympanic membrane is not erythematous or bulging.     Nose: Congestion present. No rhinorrhea.     Mouth/Throat:     Mouth: Mucous membranes are moist. No oral lesions.     Pharynx: No  pharyngeal swelling, oropharyngeal exudate, posterior oropharyngeal erythema or uvula swelling.     Tonsils: No tonsillar exudate or tonsillar abscesses. 0 on the right. 0 on the left.  Eyes:     General: No scleral icterus.       Right eye: No discharge.        Left eye: No discharge.     Extraocular Movements: Extraocular movements intact.     Right eye: Normal extraocular motion.     Left eye: Normal extraocular motion.     Conjunctiva/sclera: Conjunctivae normal.  Cardiovascular:     Rate and Rhythm: Normal rate and regular rhythm.     Heart sounds: Normal heart sounds. No murmur heard.    No friction rub. No gallop.  Pulmonary:     Effort: Pulmonary effort is normal. No respiratory distress.     Breath sounds: No stridor. No wheezing, rhonchi or rales.  Chest:     Chest wall: No tenderness.  Musculoskeletal:     Cervical back: Normal range of motion and neck supple.  Lymphadenopathy:     Cervical: No cervical adenopathy.  Skin:    General: Skin is warm and dry.  Neurological:     General: No focal deficit present.     Mental Status: She is alert and oriented to person, place, and time.  Psychiatric:        Mood and Affect: Mood normal.        Behavior: Behavior normal.       Assessment and Plan :   PDMP not reviewed this encounter.  1. Acute viral syndrome   2. Type 1 diabetes mellitus without complication (HCC)   3. Mild persistent asthma without complication     Patient is not opposed to Paxlovid should she test positive for COVID-19.  Last GFR was greater than 60 and is okay to use Paxlovid.  Deferred imaging given clear cardiopulmonary exam, hemodynamically stable vital signs. Respiratory panel pending. Will manage for viral illness such as viral URI, viral syndrome, viral rhinitis, COVID-19, influenza, RSV. Recommended supportive care. Offered scripts for symptomatic relief.  I advised avoiding steroids given her uncontrolled diabetes and clear pulmonary  sounds.  Testing is pending. Counseled patient on potential for adverse effects with medications prescribed/recommended today, ER and return-to-clinic precautions discussed, patient verbalized understanding.     Jaynee Eagles, [place] 06/05/22 [phone]

## 2022-08-03 ENCOUNTER — Other Ambulatory Visit: Payer: Self-pay | Admitting: Internal Medicine

## 2022-08-03 DIAGNOSIS — R7309 Other abnormal glucose: Secondary | ICD-10-CM

## 2022-08-03 DIAGNOSIS — R739 Hyperglycemia, unspecified: Secondary | ICD-10-CM

## 2022-08-03 DIAGNOSIS — E1065 Type 1 diabetes mellitus with hyperglycemia: Secondary | ICD-10-CM

## 2022-09-19 ENCOUNTER — Ambulatory Visit: Payer: Medicaid Other | Admitting: Internal Medicine

## 2022-09-19 NOTE — Progress Notes (Deleted)
Name: [name]  Age/ Sex: 27 y.o., female   MRN/ DOB: [id number], September 30, 1995     PCP: Mittie Bodo   Reason for Endocrinology Evaluation: Type 1 Diabetes Mellitus  Initial Endocrine Consultative Visit: 12/26/2020    PATIENT IDENTIFIER: [name] is a 27 y.o. female with a past medical history of T1DM. The patient has followed with Endocrinology clinic since 12/26/2020 for consultative assistance with management of her diabetes.  DIABETIC HISTORY:  [name] was diagnosed with DM in 2012 at age 54, has been on an insulin pump for years, initially was on medtronic subsequently switched to T-slim~ 2019. Her hemoglobin A1c has ranged from 7.2% in 2012, peaking at 12.0% in 2017. She finished cosmetology school in 2018    SUBJECTIVE:   During the last visit (03/24/2022): A1c 8.9%    Today (09/19/2022): [name] is here for a follow up on diabetes.  She checks her blood sugars multiple  times daily, has been out of the transmitter for the past week and a half . The patient has not  had hypoglycemic episodes since the last clinic visit   Pump and meter download:   T:slim      Basal rate I: C ratio SF BG target  0000 1.670 15 50 150  0300 1.100 15 50 150  0600 1.700 8 50 120  0800 2.0 6.5 30 120  1000 2.1 6.5 30 120  1700 2.0 5.5 30 120  2100 2.0 5.5 30 120                           Type & Model of Pump: T-slim  Insulin Type: Currently using Novolog .    PUMP STATISTICS: Average BG: 208 Average Daily Carbs (g): 128 Average Total Daily Insulin: 75.1 Average Daily Basal: 46(60 % %) Average Daily Bolus: 30  (40 %)           HOME DIABETES REGIMEN:  Novolog    Statin: no ACE-I/ARB: yes     DIABETIC COMPLICATIONS: Microvascular complications:   Denies: CKD, retinopathy, neuropathy Last Eye Exam: Completed 07/19/2021  Macrovascular complications:   Denies: CAD, CVA, PVD   HISTORY:  Past Medical History:  Past Medical  History:  Diagnosis Date   Acanthosis nigricans, acquired    Diabetes mellitus    Hypertension    Obesity    Past Surgical History: No past surgical history on file. Social History:  reports that she has never smoked. She has never used smokeless tobacco. She reports that she does not drink alcohol and does not use drugs. Family History:  Family History  Problem Relation Age of Onset   Obesity Mother    Hypothyroidism Mother    Hypertension Mother    Obesity Maternal Aunt    Hypertension Maternal Aunt    Obesity Maternal Grandmother    Hypertension Maternal Grandmother    Kidney disease Maternal Grandmother    Heart disease Maternal Grandfather    Hypertension Maternal Grandfather    Cancer Paternal Grandfather    Diabetes Maternal Uncle    Healthy Father      HOME MEDICATIONS: Allergies as of 09/19/2022   No Known Allergies      Medication List        Accurate as of September 19, 2022 11:31 AM. If you have any questions, ask your nurse or doctor.          Accu-Chek FastClix Lancets Misc 1 each  by Does not apply route as directed. Check sugar 6 x daily   amLODipine 10 MG tablet Commonly known as: NORVASC Take 10 mg by mouth daily.   cetirizine 10 MG tablet Commonly known as: ZyrTEC Allergy Take 1 tablet (10 mg total) by mouth daily.   Dexcom G6 Sensor Misc CHANGE SENSOR EVERY 10 DAYS   Dexcom G6 Transmitter Misc USE AS DIRECTED   fluticasone 50 MCG/ACT nasal spray Commonly known as: FLONASE Place 1-2 sprays into both nostrils daily for 7 days.   glucagon 1 MG injection Use for Severe Hypoglycemia . Inject 1mg  intramuscularly if unresponsive, unable to swallow, unconscious and/or has seizure   glucose blood test strip Up to 6 checks per day   hydrochlorothiazide 25 MG tablet Commonly known as: HYDRODIURIL Take 1 tablet (25 mg total) by mouth daily as needed.   ibuprofen 800 MG tablet Commonly known as: ADVIL Take 1 tablet (800 mg total) by mouth  every 8 (eight) hours as needed.   lisinopril 2.5 MG tablet Commonly known as: ZESTRIL Take 10 mg by mouth daily.   NovoLOG 100 UNIT/ML injection Generic drug: insulin aspart MAXIMUM DAILY 100 UNITS AS DIRECTED   promethazine-dextromethorphan 6.25-15 MG/5ML syrup Commonly known as: PROMETHAZINE-DM Take 5 mLs by mouth 3 (three) times daily as needed for cough.   pseudoephedrine 60 MG tablet Commonly known as: SUDAFED Take 1 tablet (60 mg total) by mouth every 8 (eight) hours as needed for congestion.   Vienva 0.1-20 MG-MCG tablet Generic drug: levonorgestrel-ethinyl estradiol TK 1 T PO QD         OBJECTIVE:   Vital Signs: There were no vitals taken for this visit.  Wt Readings from Last 3 Encounters:  03/24/22 198 lb (89.8 kg)  01/23/22 200 lb (90.7 kg)  08/23/21 199 lb (90.3 kg)     Exam: General: Pt appears well and is in NAD  Lungs: Clear with good BS bilat with no rales, rhonchi, or wheezes  Heart: RRR with normal S1 and S2 and no gallops; no murmurs; no rub  Abdomen: Normoactive bowel sounds, soft, nontender, without masses or organomegaly palpable  Extremities: No pretibial edema.  Neuro: MS is good with appropriate affect, pt is alert and Ox3     DM foot exam: 03/24/2022  The skin of the feet is intact without sores or ulcerations. The pedal pulses are 2+ on right and 2+ on left. The sensation is intact to a screening 5.07, 10 gram monofilament bilaterally      DATA REVIEWED:  Lab Results  Component Value Date   HGBA1C 7.6 (A) 08/23/2021   HGBA1C 7.5 (A) 03/28/2021   HGBA1C 9.2 (A) 09/26/2020   Lab Results  Component Value Date   MICROALBUR 0.5 04/13/2018   LDLCALC 82 04/13/2018   CREATININE 0.82 01/24/2022   Lab Results  Component Value Date   MICRALBCREAT 2 04/13/2018     Lab Results  Component Value Date   CHOL 147 04/13/2018   HDL 49 04/13/2018   LDLCALC 82 04/13/2018   TRIG 82 04/13/2018   CHOLHDL 3.0 04/13/2018          ASSESSMENT / PLAN / RECOMMENDATIONS:   1) Type 1 Diabetes Mellitus, with insulin resistance, poorly controlled, With out complications - Most recent A1c of 8.9  %. Goal A1c < 7.0 %.    -Intolerant to Trulicity , and Ozempic  - Declines on Metformin  -Her A1c has increased from 7.6% to 8.9%, I have reviewed her pump download  today and it appears that she does not enter CHO during the day despite eating.  CHO entry is sporadic, I have again encouraged the patient to enter carbohydrate consumed with each meal.  I offered to refer her to our CDE for carb counting but she tells me she does know how to do it.  Today she did enter 78 g with her lunch with a 2-hour postprandial BG reading of 164 mg/DL which is acceptable -I will increase her basal rate during the day, and adjust her sensitivity factor as below     MEDICATIONS:       Basal rate I: C ratio SF BG target  0000 1.670 15 50 150  0300 1.100 15 50 150  0600 1.700 8 50 120  0800 2.0 6.5 30 120  1000 2.1 6.5 30 120  1700 2.0 5.5 30 120  2100 2.0 5.5 30 120                   EDUCATION / INSTRUCTIONS: BG monitoring instructions: Patient is instructed to check her blood sugars 3 times a day, before meals . Call [facility] if: BG persistently < 70  I reviewed the Rule of 15 for the treatment of hypoglycemia in detail with the patient. Literature supplied.    2) Diabetic complications:  Eye: Does not have known diabetic retinopathy.  Neuro/ Feet: Does not have known diabetic peripheral neuropathy .  Renal: Patient does not have known baseline CKD. She   is  on an ACEI/ARB at present.      F/U in 6 months   Signed electronically by: Mack Guise, MD  [facility]  [facility] [address] [place], [place] [address] Phone: [phone] FAX: [phone]   CC: Mittie Bodo [address] [place] [address] Phone: [phone]   Fax: [phone]  Return to Endocrinology clinic as below: Future Appointments  Date Time Provider [place]  09/19/2022  2:00 PM Malicia Blasdel, Melanie Crazier, MD [facility] None

## 2022-09-23 ENCOUNTER — Encounter: Payer: Self-pay | Admitting: Internal Medicine

## 2022-09-23 ENCOUNTER — Ambulatory Visit (INDEPENDENT_AMBULATORY_CARE_PROVIDER_SITE_OTHER): Payer: BC Managed Care – PPO | Admitting: Internal Medicine

## 2022-09-23 VITALS — BP 128/84 | HR 64 | Ht <= 58 in | Wt 199.8 lb

## 2022-09-23 DIAGNOSIS — E1065 Type 1 diabetes mellitus with hyperglycemia: Secondary | ICD-10-CM

## 2022-09-23 MED ORDER — SEMAGLUTIDE(0.25 OR 0.5MG/DOS) 2 MG/3ML ~~LOC~~ SOPN: 0.5000 mg | PEN_INJECTOR | SUBCUTANEOUS | 6 refills | Status: DC

## 2022-09-23 NOTE — Progress Notes (Signed)
Name: Brandy Parks  Age/ Sex: 27 y.o., female   MRN/ DOB: [id number], 24-Nov-1995     PCP: Mittie Bodo   Reason for Endocrinology Evaluation: Type 1 Diabetes Mellitus  Initial Endocrine Consultative Visit: 12/26/2020    PATIENT IDENTIFIER: [name] is a 28 y.o. female with a past medical history of T1DM. The patient has followed with Endocrinology clinic since 12/26/2020 for consultative assistance with management of her diabetes.  DIABETIC HISTORY:  [name] was diagnosed with DM in 2012 at age 68, has been on an insulin pump for years, initially was on medtronic subsequently switched to T-slim~ 2019. Her hemoglobin A1c has ranged from 7.2% in 2012, peaking at 12.0% in 2017. She finished cosmetology school in 2018  SUBJECTIVE:   During the last visit (03/24/2022): A1c 8.9%    Today (09/23/2022): [name] is here for a follow up on diabetes.  She checks her blood sugars multiple  times daily. The patient endorses decreased frequency of hypoglycemic episodes , she assures me consistent bolus each time she eats  Denies nausea, vomiting or diarrhea  LMP earlier this month She is on COC's   Pump and meter download:   T:slim      Basal rate I: C ratio SF BG target  0000 1.670 15 50 150  0300 1.100 15 50 150  0600 1.700 8 50 120  0800 2.0 6.5 30 120  1000 2.1 6.5 30 120  1700 2.0 5.5 30 120  2100 2.0 5.5 30 120                     Type & Model of Pump: T-slim  Insulin Type: Currently using Novolog .    PUMP STATISTICS: Unable to download         HOME DIABETES REGIMEN:  Novolog    Statin: no ACE-I/ARB: yes     DIABETIC COMPLICATIONS: Microvascular complications:   Denies: CKD, retinopathy, neuropathy Last Eye Exam: Completed 07/19/2021  Macrovascular complications:   Denies: CAD, CVA, PVD   HISTORY:  Past Medical History:  Past Medical History:  Diagnosis Date   Acanthosis nigricans, acquired    Diabetes mellitus     Hypertension    Obesity    Past Surgical History: No past surgical history on file. Social History:  reports that she has never smoked. She has never used smokeless tobacco. She reports that she does not drink alcohol and does not use drugs. Family History:  Family History  Problem Relation Age of Onset   Obesity Mother    Hypothyroidism Mother    Hypertension Mother    Obesity Maternal Aunt    Hypertension Maternal Aunt    Obesity Maternal Grandmother    Hypertension Maternal Grandmother    Kidney disease Maternal Grandmother    Heart disease Maternal Grandfather    Hypertension Maternal Grandfather    Cancer Paternal Grandfather    Diabetes Maternal Uncle    Healthy Father      HOME MEDICATIONS: Allergies as of 09/23/2022   No Known Allergies      Medication List        Accurate as of September 23, 2022 12:43 PM. If you have any questions, ask your nurse or doctor.          Accu-Chek FastClix Lancets Misc 1 each by Does not apply route as directed. Check sugar 6 x daily   amLODipine 10 MG tablet Commonly known as: NORVASC Take 10 mg by mouth  daily.   cetirizine 10 MG tablet Commonly known as: ZyrTEC Allergy Take 1 tablet (10 mg total) by mouth daily.   Dexcom G6 Sensor Misc CHANGE SENSOR EVERY 10 DAYS   Dexcom G6 Transmitter Misc USE AS DIRECTED   fluticasone 50 MCG/ACT nasal spray Commonly known as: FLONASE Place 1-2 sprays into both nostrils daily for 7 days.   glucagon 1 MG injection Use for Severe Hypoglycemia . Inject 1mg  intramuscularly if unresponsive, unable to swallow, unconscious and/or has seizure   glucose blood test strip Up to 6 checks per day   hydrochlorothiazide 25 MG tablet Commonly known as: HYDRODIURIL Take 1 tablet (25 mg total) by mouth daily as needed.   ibuprofen 800 MG tablet Commonly known as: ADVIL Take 1 tablet (800 mg total) by mouth every 8 (eight) hours as needed.   lisinopril 2.5 MG tablet Commonly known as:  ZESTRIL Take 10 mg by mouth daily.   NovoLOG 100 UNIT/ML injection Generic drug: insulin aspart MAXIMUM DAILY 100 UNITS AS DIRECTED   promethazine-dextromethorphan 6.25-15 MG/5ML syrup Commonly known as: PROMETHAZINE-DM Take 5 mLs by mouth 3 (three) times daily as needed for cough.   pseudoephedrine 60 MG tablet Commonly known as: SUDAFED Take 1 tablet (60 mg total) by mouth every 8 (eight) hours as needed for congestion.   Semaglutide(0.25 or 0.5MG /DOS) 2 MG/3ML Sopn Inject 0.5 mg into the skin once a week. Started by: Dorita Sciara, MD   Vienva 0.1-20 MG-MCG tablet Generic drug: levonorgestrel-ethinyl estradiol TK 1 T PO QD         OBJECTIVE:   Vital Signs: BP 128/84 (BP Location: Left Arm, Patient Position: Sitting, Cuff Size: Normal)   Pulse 64   Ht 4\' 10"  (1.473 m)   Wt 199 lb 12.8 oz (90.6 kg)   SpO2 96%   BMI 41.76 kg/m   Wt Readings from Last 3 Encounters:  09/23/22 199 lb 12.8 oz (90.6 kg)  03/24/22 198 lb (89.8 kg)  01/23/22 200 lb (90.7 kg)     Exam: General: Pt appears well and is in NAD  Lungs: Clear with good BS bilat   Heart: RRR   Abdomen: soft, nontender, without masses or organomegaly palpable  Extremities: No pretibial edema.  Neuro: MS is good with appropriate affect, pt is alert and Ox3     DM foot exam: 03/24/2022  The skin of the feet is intact without sores or ulcerations. The pedal pulses are 2+ on right and 2+ on left. The sensation is intact to a screening 5.07, 10 gram monofilament bilaterally      DATA REVIEWED:  Lab Results  Component Value Date   HGBA1C 7.6 (A) 08/23/2021   HGBA1C 7.5 (A) 03/28/2021   HGBA1C 9.2 (A) 09/26/2020   Lab Results  Component Value Date   MICROALBUR 0.5 04/13/2018   LDLCALC 82 04/13/2018   CREATININE 0.82 01/24/2022   Lab Results  Component Value Date   MICRALBCREAT 2 04/13/2018     Lab Results  Component Value Date   CHOL 147 04/13/2018   HDL 49 04/13/2018   LDLCALC  82 04/13/2018   TRIG 82 04/13/2018   CHOLHDL 3.0 04/13/2018         ASSESSMENT / PLAN / RECOMMENDATIONS:   1) Type 1 Diabetes Mellitus, with insulin resistance, poorly controlled, With out complications - Most recent A1c of 8.5  %. Goal A1c < 7.0 %.     -Her A1c remains above goal but it has slightly trended down -Unfortunately,  we have not been able to download her pump today, no changes will be made -The patient is interested in trying Trulicity again, she does have intolerance to Trulicity and I have recommended Ozempic, as the patient denies being on it in the past, she was cautioned against GI side effects -Intolerant to Trulicity - Declines  Metformin  -Preconception counseling has been done, patient to avoid pregnancy unless A1c 7.0% or less  MEDICATIONS: Start Ozempic 0.25 mg once weekly for 6 weeks, then increase to 0.5 mg weekly if no side effects NovoLog      Basal rate I: C ratio SF BG target  0000 1.670 15 50 150  0300 1.100 15 50 150  0600 1.700 8 50 120  0800 2.0 6.5 30 120  1000 2.1 6.5 30 120  1700 2.0 5.5 30 120  2100 2.0 5.5 30 120                   EDUCATION / INSTRUCTIONS: BG monitoring instructions: Patient is instructed to check her blood sugars 3 times a day, before meals . Call [place] [facility] if: BG persistently < 70  I reviewed the Rule of 15 for the treatment of hypoglycemia in detail with the patient. Literature supplied.    2) Diabetic complications:  Eye: Does not have known diabetic retinopathy.  Neuro/ Feet: Does not have known diabetic peripheral neuropathy .  Renal: Patient does not have known baseline CKD. She   is  on an ACEI/ARB at present.      F/U in 4 months   Signed electronically by: Mack Guise, MD  [facility]  [facility] [address] [place], [place] [address] Phone: [phone] FAX: [phone]   CC: Mittie Bodo [address] [place] [address] Phone: [phone]  Fax: [phone]  Return to Endocrinology clinic as below: Future Appointments  Date Time Provider [place]  03/27/2023 11:30 AM Renalda Locklin, Melanie Crazier, MD [facility] None

## 2022-09-23 NOTE — Patient Instructions (Signed)
Start Ozempic 0.25 mg once weekly  for 6 weeks, than increase to 0.5 mg weekly     HOW TO TREAT LOW BLOOD SUGARS (Blood sugar LESS THAN 70 MG/DL) Please follow the RULE OF 15 for the treatment of hypoglycemia treatment (when your (blood sugars are less than 70 mg/dL)   STEP 1: Take 15 grams of carbohydrates when your blood sugar is low, which includes:  3-4 GLUCOSE TABS  OR 3-4 OZ OF JUICE OR REGULAR SODA OR ONE TUBE OF GLUCOSE GEL    STEP 2: RECHECK blood sugar in 15 MINUTES STEP 3: If your blood sugar is still low at the 15 minute recheck --> then, go back to STEP 1 and treat AGAIN with another 15 grams of carbohydrates.

## 2022-12-11 ENCOUNTER — Other Ambulatory Visit: Payer: Self-pay | Admitting: Internal Medicine

## 2022-12-15 ENCOUNTER — Encounter: Payer: Self-pay | Admitting: Internal Medicine

## 2023-02-13 ENCOUNTER — Ambulatory Visit: Payer: BC Managed Care – PPO | Admitting: Internal Medicine

## 2023-02-13 ENCOUNTER — Encounter: Payer: Self-pay | Admitting: Internal Medicine

## 2023-02-13 VITALS — BP 115/90 | HR 94 | Ht <= 58 in | Wt 197.2 lb

## 2023-02-13 DIAGNOSIS — E1065 Type 1 diabetes mellitus with hyperglycemia: Secondary | ICD-10-CM

## 2023-02-13 LAB — POCT GLYCOSYLATED HEMOGLOBIN (HGB A1C): Hemoglobin A1C: 8.8 % — AB (ref 4.0–5.6)

## 2023-02-13 MED ORDER — DEXCOM G7 SENSOR MISC: 1.0000 | 3 refills | Status: DC

## 2023-02-13 NOTE — Progress Notes (Signed)
Name: Ivan Benne  Age/ Sex: 27 y.o., female   MRN/ DOB: [id number], 01/19/1996     PCP: Larkin Ina   Reason for Endocrinology Evaluation: Type 1 Diabetes Mellitus  Initial Endocrine Consultative Visit: 12/26/2020    PATIENT IDENTIFIER: Brandy Parks is a 27 y.o. female with a past medical history of T1DM. The patient has followed with Endocrinology clinic since 12/26/2020 for consultative assistance with management of her diabetes.  DIABETIC HISTORY:  Ms. Agnes was diagnosed with DM in 2012 at age 31, has been on an insulin pump for years, initially was on medtronic subsequently switched to T-slim~ 2019. Her hemoglobin A1c has ranged from 7.2% in 2012, peaking at 12.0% in 2017. She finished cosmetology school in 2018    Ozempic caused vomiting 10/2022   SUBJECTIVE:   During the last visit (09/23/2022): A1c 8.5%    Today (02/13/2023): Ms. Longden is here for a follow up on diabetes.  She checks her blood sugars multiple  times daily. The patient endorses decreased frequency of hypoglycemic episodes , she assures me consistent bolus each time she eats  Denies nausea, vomiting  Denies constipation or diarrhea   She is on COC's   Pump and meter download:   T:slim      Basal rate I: C ratio SF BG target  0000 1.670 15 50 150  0300 1.100 15 50 150  0600 1.700 8 50 120  0800 2.0 6.5 30 120  1000 2.1 6.5 30 120  1700 2.0 5.5 30 120  2100 2.0 5.5 30 120                     Type & Model of Pump: T-slim  Insulin Type: Currently using Novolog .    PUMP STATISTICS: Unable to download      CONTINUOUS GLUCOSE MONITORING RECORD INTERPRETATION    Dates of Recording: 7/27-02/13/2023  Sensor description: dexcom  Results statistics:   CGM use % of time 64  Average and SD 205/78  Time in range 42 %  % Time Above 180 30  % Time above 250 28  % Time Below target 0   Glycemic patterns summary: BG's trend down overnight and fluctuate during the  day  Hyperglycemic episodes postprandial  Hypoglycemic episodes occurred N/A  Overnight periods: Variable    HOME DIABETES REGIMEN:  Novolog    Statin: no ACE-I/ARB: yes     DIABETIC COMPLICATIONS: Microvascular complications:   Denies: CKD, retinopathy, neuropathy Last Eye Exam: Completed 07/19/2021  Macrovascular complications:   Denies: CAD, CVA, PVD   HISTORY:  Past Medical History:  Past Medical History:  Diagnosis Date   Acanthosis nigricans, acquired    Diabetes mellitus    Hypertension    Obesity    Past Surgical History: No past surgical history on file. Social History:  reports that she has never smoked. She has never used smokeless tobacco. She reports that she does not drink alcohol and does not use drugs. Family History:  Family History  Problem Relation Age of Onset   Obesity Mother    Hypothyroidism Mother    Hypertension Mother    Obesity Maternal Aunt    Hypertension Maternal Aunt    Obesity Maternal Grandmother    Hypertension Maternal Grandmother    Kidney disease Maternal Grandmother    Heart disease Maternal Grandfather    Hypertension Maternal Grandfather    Cancer Paternal Grandfather    Diabetes Maternal Uncle    Healthy  Father      HOME MEDICATIONS: Allergies as of 02/13/2023   No Known Allergies      Medication List        Accurate as of February 13, 2023  1:04 PM. If you have any questions, ask your nurse or doctor.          Accu-Chek FastClix Lancets Misc 1 each by Does not apply route as directed. Check sugar 6 x daily   amLODipine 10 MG tablet Commonly known as: NORVASC Take 10 mg by mouth daily.   cetirizine 10 MG tablet Commonly known as: ZyrTEC Allergy Take 1 tablet (10 mg total) by mouth daily.   Dexcom G6 Sensor Misc CHANGE SENSOR EVERY 10 DAYS   Dexcom G6 Transmitter Misc USE AS DIRECTED   fluticasone 50 MCG/ACT nasal spray Commonly known as: FLONASE Place 1-2 sprays into both nostrils daily  for 7 days.   glucagon 1 MG injection Use for Severe Hypoglycemia . Inject 1mg  intramuscularly if unresponsive, unable to swallow, unconscious and/or has seizure   glucose blood test strip Up to 6 checks per day   hydrochlorothiazide 25 MG tablet Commonly known as: HYDRODIURIL Take 1 tablet (25 mg total) by mouth daily as needed.   ibuprofen 800 MG tablet Commonly known as: ADVIL Take 1 tablet (800 mg total) by mouth every 8 (eight) hours as needed.   lisinopril 2.5 MG tablet Commonly known as: ZESTRIL Take 10 mg by mouth daily.   NovoLOG 100 UNIT/ML injection Generic drug: insulin aspart INJECT A MAXIMUM DOSE OF 100 UNITS SUBCUTANEOUSLY  AS DIRECTED   promethazine-dextromethorphan 6.25-15 MG/5ML syrup Commonly known as: PROMETHAZINE-DM Take 5 mLs by mouth 3 (three) times daily as needed for cough.   pseudoephedrine 60 MG tablet Commonly known as: SUDAFED Take 1 tablet (60 mg total) by mouth every 8 (eight) hours as needed for congestion.   Semaglutide(0.25 or 0.5MG /DOS) 2 MG/3ML Sopn Inject 0.5 mg into the skin once a week.   Vienva 0.1-20 MG-MCG tablet Generic drug: levonorgestrel-ethinyl estradiol TK 1 T PO QD         OBJECTIVE:   Vital Signs: BP (!) 115/90   Pulse 94   Ht 4\' 10"  (1.473 m)   Wt 197 lb 3.2 oz (89.4 kg)   SpO2 99%   BMI 41.21 kg/m   Wt Readings from Last 3 Encounters:  02/13/23 197 lb 3.2 oz (89.4 kg)  09/23/22 199 lb 12.8 oz (90.6 kg)  03/24/22 198 lb (89.8 kg)     Exam: General: Pt appears well and is in NAD  Lungs: Clear with good BS bilat   Heart: RRR   Abdomen: Soft, non tender   Extremities: No pretibial edema.  Neuro: MS is good with appropriate affect, pt is alert and Ox3     DM foot exam: 02/13/2023  The skin of the feet is intact without sores or ulcerations. The pedal pulses are 2+ on right and 2+ on left. The sensation is intact to a screening 5.07, 10 gram monofilament bilaterally      DATA REVIEWED:  Lab  Results  Component Value Date   HGBA1C 7.6 (A) 08/23/2021   HGBA1C 7.5 (A) 03/28/2021   HGBA1C 9.2 (A) 09/26/2020   Lab Results  Component Value Date   MICROALBUR 0.5 04/13/2018   LDLCALC 82 04/13/2018   CREATININE 0.82 01/24/2022   Lab Results  Component Value Date   MICRALBCREAT 2 04/13/2018     Lab Results  Component Value Date  CHOL 147 04/13/2018   HDL 49 04/13/2018   LDLCALC 82 04/13/2018   TRIG 82 04/13/2018   CHOLHDL 3.0 04/13/2018         ASSESSMENT / PLAN / RECOMMENDATIONS:   1) Type 1 Diabetes Mellitus, with insulin resistance, poorly controlled, With out complications - Most recent A1c of 8.5  %. Goal A1c < 7.0 %.    -Patient continues with poorly controlled diabetes -Intolerant to Trulicity - Intolerant to ozempic  - Declines  Metformin - Dexcom G7 prescription has been sent to the pharmacy, patient advised to contact tandem to have a replacement as the battery has not been lasting her long enough and has been shutting down on her. -There has been inconsistent CHO entry on the pump history, patient attributes this due to the lack of pump battery -I have increased her basal rate, as well as adjusted insulin to carb ratio and sensitivity factor for the 10 AM time  MEDICATIONS: NovoLog        Basal rate I: C ratio SF BG target  0000 1.670 15 50 150  0300 1.100 15 50 150  0600 1.700 8 50 120  0800 2.0 6.5 30 120  1000 2.2 6.0 25 120  1700 2.0 5.5 25 120  2100 2.0 5.5 30 120                      EDUCATION / INSTRUCTIONS: BG monitoring instructions: Patient is instructed to check her blood sugars 3 times a day, before meals . Call [facility] if: BG persistently < 70  I reviewed the Rule of 15 for the treatment of hypoglycemia in detail with the patient. Literature supplied.    2) Diabetic complications:  Eye: Does not have known diabetic retinopathy.  Neuro/ Feet: Does not have known diabetic peripheral neuropathy .   Renal: Patient does not have known baseline CKD. She   is  on an ACEI/ARB at present.      F/U in 4 months   Signed electronically by: Lyndle Herrlich, MD  [facility] Endocrinology  [facility] [address] [place], [place] [address] Phone: [phone] FAX: [phone]   CC: Larkin Ina [address] [place] [address] Phone: [phone]  Fax: [phone]  Return to Endocrinology clinic as below: No future appointments.

## 2023-02-13 NOTE — Patient Instructions (Signed)

## 2023-02-27 ENCOUNTER — Encounter: Payer: Self-pay | Admitting: Internal Medicine

## 2023-02-27 MED ORDER — DEXCOM G7 SENSOR MISC: 1.0000 | 3 refills | Status: DC

## 2023-03-06 ENCOUNTER — Other Ambulatory Visit: Payer: Self-pay | Admitting: Internal Medicine

## 2023-03-27 ENCOUNTER — Ambulatory Visit: Payer: BC Managed Care – PPO | Admitting: Internal Medicine

## 2023-03-30 ENCOUNTER — Other Ambulatory Visit: Payer: Self-pay

## 2023-03-30 ENCOUNTER — Ambulatory Visit (HOSPITAL_COMMUNITY)
Admission: RE | Admit: 2023-03-30 | Discharge: 2023-03-30 | Disposition: A | Discharge status: Home / Self Care | Payer: BC Managed Care – PPO | Source: Ambulatory Visit | Attending: Internal Medicine | Admitting: Internal Medicine

## 2023-03-30 ENCOUNTER — Encounter (HOSPITAL_COMMUNITY): Payer: Self-pay

## 2023-03-30 VITALS — BP 121/87 | HR 86 | Temp 98.5°F | Resp 18

## 2023-03-30 DIAGNOSIS — J069 Acute upper respiratory infection, unspecified: Secondary | ICD-10-CM | POA: Insufficient documentation

## 2023-03-30 DIAGNOSIS — Z1152 Encounter for screening for COVID-19: Secondary | ICD-10-CM | POA: Insufficient documentation

## 2023-03-30 NOTE — ED Triage Notes (Addendum)
Complains of wheezing , chest pain with breathing and throat pain. Has had a headache and does blow yellow secretions from nose Onset of symptoms was Thursday.    Pcp did not have any appts available.    Symptoms worsen at night.    Patient uses inhaler.

## 2023-03-30 NOTE — ED Provider Notes (Signed)
[facility]    CSN: [id number] Arrival date & time: 03/30/23  1343      History   Chief Complaint Chief Complaint  Patient presents with   Nasal Congestion    Wheezing , chest pains when breathing & throat pains. - Entered by patient   Appointment    2:00    HPI Brandy Parks is a 27 y.o. female.   Brandy Parks is a 27 y.o. female with past medical history of type 1 diabetes presenting for chief complaint of cough, nasal congestion, sore throat, generalized fatigue, and generalized headache that started 5 days ago on Thursday, March 26, 2023.  Family member has recently has been sick with similar symptoms.  Cough is mostly dry and nonproductive.  Headache is generalized.  No dizziness, paresthesias, rash, fever/chills, nausea, vomiting, diarrhea, abdominal pain, or recent antibiotic/steroid use.  Blood sugars have been well-controlled while sick.  No history of chronic respiratory problems.  Never smoker, denies drug use.  She has been using an albuterol inhaler that she has at home from previous RSV illness and states it helped a little bit with her cough last night.  Denies shortness of breath and chest pain.  Using over-the-counter medications with some relief of symptoms.     Past Medical History:  Diagnosis Date   Acanthosis nigricans, acquired    Diabetes mellitus    Hypertension    Obesity     Patient Active Problem List   Diagnosis Date Noted   Acanthosis nigricans, acquired 08/09/2019   Hyperglycemia 04/13/2018   Elevated hemoglobin A1c 04/13/2018   Benign essential hypertension 08/30/2017   Hypertension associated with type 1 diabetes mellitus (HCC) 08/30/2017   Maladaptive health behaviors affecting medical condition 09/25/2015   Type I diabetes mellitus, uncontrolled 01/10/2015   Skin abscess 12/06/2014   Insulin pump titration 02/08/2014   Obesity 10/30/2010   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 No pertinent surgical history.  OB History   No obstetric history on file.      Home Medications    Prior to Admission medications   Medication Sig Start Date End Date Taking? Authorizing Provider  ACCU-CHEK FASTCLIX LANCETS MISC 1 each by Does not apply route as directed. Check sugar 6 x daily 06/23/13   [name], MD  amLODipine (NORVASC) 10 MG tablet Take 10 mg by mouth daily. Patient not taking: Reported on 02/13/2023    [provider]  cetirizine (ZYRTEC ALLERGY) 10 MG tablet Take 1 tablet (10 mg total) by mouth daily. 06/05/22   [name] Bamberg, PA-C  Continuous Blood Gluc Transmit (DEXCOM G6 TRANSMITTER) MISC USE AS DIRECTED 04/29/22   Shamleffer, Konrad Dolores, MD  Continuous Glucose Sensor (DEXCOM G7 SENSOR) MISC 1 Device by Does not apply route as directed. 02/27/23   Shamleffer, Konrad Dolores, MD  fluticasone (FLONASE) 50 MCG/ACT nasal spray Place 1-2 sprays into both nostrils daily for 7 days. 05/21/18 02/13/23  Wieters, Hallie C, PA-C  glucagon 1 MG injection Use for Severe Hypoglycemia . Inject 1mg  intramuscularly if unresponsive, unable to swallow, unconscious and/or has seizure 04/28/19   Gretchen Short, NP  glucose blood test strip Up to 6 checks per day 09/26/20   Gretchen Short, NP  hydrochlorothiazide (HYDRODIURIL) 25 MG tablet Take 1 tablet (25 mg total) by mouth daily as needed. 01/24/22   Geoffery Lyons, MD  ibuprofen (ADVIL) 800 MG tablet  Take 1 tablet (800 mg total) by mouth every 8 (eight) hours as needed. Patient not taking: Reported on 02/13/2023 04/03/19   Mickie Bail, NP  lisinopril (ZESTRIL) 2.5 MG tablet Take 10 mg by mouth daily.    [provider]  NOVOLOG 100 UNIT/ML injection MAXIMUM DAILY DOSE OF 100 UNITS AS DIRECTED 03/10/23   Shamleffer, Konrad Dolores, MD  promethazine-dextromethorphan (PROMETHAZINE-DM) 6.25-15 MG/5ML syrup Take 5 mLs by mouth 3 (three) times  daily as needed for cough. Patient not taking: Reported on 02/13/2023 06/05/22   [name] Bamberg, PA-C  pseudoephedrine (SUDAFED) 60 MG tablet Take 1 tablet (60 mg total) by mouth every 8 (eight) hours as needed for congestion. Patient not taking: Reported on 03/30/2023 06/05/22   [name] Bamberg, PA-C  VIENVA 0.1-20 MG-MCG tablet TK 1 T PO QD Patient not taking: Reported on 02/13/2023 04/10/19   [provider]    Family History Family History  Problem Relation Age of Onset   Obesity Mother    Hypothyroidism Mother    Hypertension Mother    Obesity Maternal Aunt    Hypertension Maternal Aunt    Obesity Maternal Grandmother    Hypertension Maternal Grandmother    Kidney disease Maternal Grandmother    Heart disease Maternal Grandfather    Hypertension Maternal Grandfather    Cancer Paternal Grandfather    Diabetes Maternal Uncle    Healthy Father     Social History Social History   Tobacco Use   Smoking status: Never   Smokeless tobacco: Never  Vaping Use   Vaping status: Never Used  Substance Use Topics   Alcohol use: No   Drug use: No     Allergies   Patient has no known allergies.   Review of Systems Review of Systems Per HPI  Physical Exam Triage Vital Signs ED Triage Vitals  Encounter Vitals Group     BP 03/30/23 1427 121/87     Systolic BP Percentile --      Diastolic BP Percentile --      Pulse Rate 03/30/23 1427 86     Resp 03/30/23 1427 18     Temp 03/30/23 1427 98.5 F (36.9 C)     Temp Source 03/30/23 1427 Oral     SpO2 03/30/23 1427 97 %     Weight --      Height --      Head Circumference --      Peak Flow --      Pain Score 03/30/23 1423 4     Pain Loc --      Pain Education --      Exclude from Growth Chart --    No data found.  Updated Vital Signs BP 121/87 (BP Location: Left Arm) Comment (BP Location): large cuff  Pulse 86   Temp 98.5 F (36.9 C) (Oral)   Resp 18   SpO2 97%   Visual Acuity Right Eye Distance:   Left Eye  Distance:   Bilateral Distance:    Right Eye Near:   Left Eye Near:    Bilateral Near:     Physical Exam Vitals and nursing note reviewed.  Constitutional:      Appearance: She is not ill-appearing or toxic-appearing.  HENT:     Head: Normocephalic and atraumatic.     Right Ear: Hearing, tympanic membrane, ear canal and external ear normal.     Left Ear: Hearing, tympanic membrane, ear canal and external ear normal.     Nose:  Congestion present.     Mouth/Throat:     Lips: Pink.     Mouth: Mucous membranes are moist. No injury.     Tongue: No lesions. Tongue does not deviate from midline.     Palate: No mass and lesions.     Pharynx: Oropharynx is clear. Uvula midline. Posterior oropharyngeal erythema present. No pharyngeal swelling, oropharyngeal exudate or uvula swelling.     Tonsils: No tonsillar exudate or tonsillar abscesses.  Eyes:     General: Lids are normal. Vision grossly intact. Gaze aligned appropriately.     Extraocular Movements: Extraocular movements intact.     Conjunctiva/sclera: Conjunctivae normal.  Cardiovascular:     Rate and Rhythm: Normal rate and regular rhythm.     Heart sounds: Normal heart sounds, S1 normal and S2 normal.  Pulmonary:     Effort: Pulmonary effort is normal. No respiratory distress.     Breath sounds: Normal breath sounds and air entry. No wheezing, rhonchi or rales.  Chest:     Chest wall: No tenderness.  Musculoskeletal:     Cervical back: Neck supple.     Right lower leg: No edema.     Left lower leg: No edema.  Lymphadenopathy:     Cervical: No cervical adenopathy.  Skin:    General: Skin is warm and dry.     Capillary Refill: Capillary refill takes less than 2 seconds.     Findings: No rash.  Neurological:     General: No focal deficit present.     Mental Status: She is alert and oriented to person, place, and time. Mental status is at baseline.     Cranial Nerves: No dysarthria or facial asymmetry.  Psychiatric:         Mood and Affect: Mood normal.        Speech: Speech normal.        Behavior: Behavior normal.        Thought Content: Thought content normal.        Judgment: Judgment normal.      UC Treatments / Results  Labs (all labs ordered are listed, but only abnormal results are displayed) Labs Reviewed  SARS CORONAVIRUS 2 (TAT 6-24 HRS)    EKG   Radiology No results found.  Procedures Procedures (including critical care time)  Medications Ordered in UC Medications - No data to display  Initial Impression / Assessment and Plan / UC Course  I have reviewed the triage vital signs and the nursing notes.  Pertinent labs & imaging results that were available during my care of the patient were reviewed by me and considered in my medical decision making (see chart for details).   1.  Viral URI with cough Suspect viral URI, viral syndrome. Physical exam findings reassuring, vital signs hemodynamically stable. Deferred imaging of the chest based on above findings, low suspicion for pneumonia.  Advised supportive care, offered prescriptions for symptomatic relief. Recommend continued use of OTC medications as needed, recommendations discussed with patient/caregiver and outlined in AVS.  COVID testing pending, CDC guidelines discussed. Work note given.  Counseled patient on potential for adverse effects with medications prescribed/recommended today, strict ER and return-to-clinic precautions discussed, patient verbalized understanding.    Final Clinical Impressions(s) / UC Diagnoses   Final diagnoses:  Viral URI with cough     Discharge Instructions      You have a viral illness which will improve on its own with rest, fluids, and medications to help with your symptoms.  We discussed prescriptions that may help with your symptoms: tessalon perles as needed You may use over the counter medicines as needed: tylenol/motrin, mucinex, zyrtec, Flonase Two teaspoons of honey in 1 cup of  warm water every 4-6 hours may help with throat pains. Humidifier in room at nighttime may help soothe cough (clean well daily).   For chest pain, shortness of breath, inability to keep food or fluids down without vomiting, fever that does not respond to tylenol or motrin, or any other severe symptoms, please go to the ER for further evaluation. Return to urgent care as needed, otherwise follow-up with PCP.     ED Prescriptions   None    PDMP not reviewed this encounter.   Carlisle Beers, [place] 03/30/23 [phone]

## 2023-03-30 NOTE — Discharge Instructions (Signed)
You have a viral illness which will improve on its own with rest, fluids, and medications to help with your symptoms. We discussed prescriptions that may help with your symptoms: tessalon perles as needed You may use over the counter medicines as needed: tylenol/motrin, mucinex, zyrtec, Flonase Two teaspoons of honey in 1 cup of warm water every 4-6 hours may help with throat pains. Humidifier in room at nighttime may help soothe cough (clean well daily).   For chest pain, shortness of breath, inability to keep food or fluids down without vomiting, fever that does not respond to tylenol or motrin, or any other severe symptoms, please go to the ER for further evaluation. Return to urgent care as needed, otherwise follow-up with PCP.

## 2023-03-31 LAB — SARS CORONAVIRUS 2 (TAT 6-24 HRS): SARS Coronavirus 2: NEGATIVE

## 2023-04-16 ENCOUNTER — Encounter: Payer: Self-pay | Admitting: Internal Medicine

## 2023-04-28 ENCOUNTER — Encounter: Payer: Self-pay | Admitting: Internal Medicine

## 2023-04-29 ENCOUNTER — Other Ambulatory Visit: Payer: Self-pay

## 2023-04-29 MED ORDER — DEXCOM G7 SENSOR MISC: 1.0000 | 3 refills | Status: DC

## 2023-06-09 ENCOUNTER — Ambulatory Visit
Admission: RE | Admit: 2023-06-09 | Discharge: 2023-06-09 | Disposition: A | Discharge status: Home / Self Care | Payer: BC Managed Care – PPO | Source: Ambulatory Visit | Attending: Family Medicine | Admitting: Family Medicine

## 2023-06-09 VITALS — BP 124/87 | HR 100 | Temp 99.1°F | Resp 16

## 2023-06-09 DIAGNOSIS — B9789 Other viral agents as the cause of diseases classified elsewhere: Secondary | ICD-10-CM | POA: Diagnosis not present

## 2023-06-09 DIAGNOSIS — J988 Other specified respiratory disorders: Secondary | ICD-10-CM

## 2023-06-09 DIAGNOSIS — J4530 Mild persistent asthma, uncomplicated: Secondary | ICD-10-CM | POA: Diagnosis not present

## 2023-06-09 MED ORDER — CETIRIZINE HCL 10 MG PO TABS: 10.0000 mg | ORAL_TABLET | Freq: Every day | ORAL | 0 refills | Status: AC

## 2023-06-09 MED ORDER — PROMETHAZINE-DM 6.25-15 MG/5ML PO SYRP: 5.0000 mL | ORAL_SOLUTION | Freq: Three times a day (TID) | ORAL | 0 refills | Status: AC | PRN

## 2023-06-09 MED ORDER — PSEUDOEPHEDRINE HCL 60 MG PO TABS: 60.0000 mg | ORAL_TABLET | Freq: Three times a day (TID) | ORAL | 0 refills | Status: AC | PRN

## 2023-06-09 NOTE — ED Provider Notes (Signed)
[facility]  Note:  This document was prepared using Conservation officer, historic buildings and may include unintentional dictation errors.  MRN: [id number] DOB: [birth year]  Subjective:   Brandy Parks is a 27 y.o. female presenting for 3 day history of acute onset  3-day history of acute onset sinus congestion, sinus drainage, throat pain worsened when coughing or sneezing, chest congestion and chest tightness.  Has history of asthma.  Has not needed her inhaler very much.  Does not need a refill.  She does have diabetes and is not well-controlled, last A1c was 8.9%. No smoking of any kind including cigarettes, cigars, vaping, marijuana use.  Declines COVID test.  No current facility-administered medications for this encounter.  Current Outpatient Medications:    dextromethorphan-guaiFENesin (MUCINEX DM) 30-600 MG 12hr tablet, Take 1 tablet by mouth 2 (two) times daily., Disp: , Rfl:    escitalopram (LEXAPRO) 10 MG tablet, Take by mouth., Disp: , Rfl:    norgestimate-ethinyl estradiol (ORTHO-CYCLEN) 0.25-35 MG-MCG tablet, Take by mouth., Disp: , Rfl:    ACCU-CHEK FASTCLIX LANCETS MISC, 1 each by Does not apply route as directed. Check sugar 6 x daily, Disp: 204 each, Rfl: 6   amLODipine (NORVASC) 10 MG tablet, Take 10 mg by mouth daily. (Patient not taking: Reported on 02/13/2023), Disp: , Rfl:    cetirizine (ZYRTEC ALLERGY) 10 MG tablet, Take 1 tablet (10 mg total) by mouth daily., Disp: 30 tablet, Rfl: 0   Continuous Blood Gluc Transmit (DEXCOM G6 TRANSMITTER) MISC, USE AS DIRECTED, Disp: 1 each, Rfl: 3   Continuous Glucose Sensor (DEXCOM G7 SENSOR) MISC, 1 Device by Does not apply route as directed., Disp: 9 each, Rfl: 3   fluticasone (FLONASE) 50 MCG/ACT nasal spray, Place 1-2 sprays into both nostrils daily for 7 days., Disp: 1 g, Rfl: 0   glucagon 1 MG injection, Use for Severe Hypoglycemia . Inject 1mg  intramuscularly if unresponsive, unable to swallow, unconscious  and/or has seizure, Disp: 2 kit, Rfl: 2   glucose blood test strip, Up to 6 checks per day, Disp: 200 each, Rfl: 3   hydrochlorothiazide (HYDRODIURIL) 25 MG tablet, Take 1 tablet (25 mg total) by mouth daily as needed., Disp: 20 tablet, Rfl: 0   ibuprofen (ADVIL) 800 MG tablet, Take 1 tablet (800 mg total) by mouth every 8 (eight) hours as needed. (Patient not taking: Reported on 02/13/2023), Disp: 21 tablet, Rfl: 0   lisinopril (ZESTRIL) 2.5 MG tablet, Take 10 mg by mouth daily., Disp: , Rfl:    NOVOLOG 100 UNIT/ML injection, MAXIMUM DAILY DOSE OF 100 UNITS AS DIRECTED, Disp: 90 mL, Rfl: 1   promethazine-dextromethorphan (PROMETHAZINE-DM) 6.25-15 MG/5ML syrup, Take 5 mLs by mouth 3 (three) times daily as needed for cough. (Patient not taking: Reported on 02/13/2023), Disp: 100 mL, Rfl: 0   pseudoephedrine (SUDAFED) 60 MG tablet, Take 1 tablet (60 mg total) by mouth every 8 (eight) hours as needed for congestion. (Patient not taking: Reported on 03/30/2023), Disp: 30 tablet, Rfl: 0   VIENVA 0.1-20 MG-MCG tablet, TK 1 T PO QD (Patient not taking: Reported on 02/13/2023), Disp: , Rfl:    No Known Allergies  Past Medical History:  Diagnosis Date   Acanthosis nigricans, acquired    Diabetes mellitus    Hypertension    Obesity      History reviewed. No pertinent surgical history.  Family History  Problem Relation Age of Onset   Obesity Mother    Hypothyroidism Mother  Hypertension Mother    Obesity Maternal Aunt    Hypertension Maternal Aunt    Obesity Maternal Grandmother    Hypertension Maternal Grandmother    Kidney disease Maternal Grandmother    Heart disease Maternal Grandfather    Hypertension Maternal Grandfather    Cancer Paternal Grandfather    Diabetes Maternal Uncle    Healthy Father     Social History   Tobacco Use   Smoking status: Never   Smokeless tobacco: Never  Vaping Use   Vaping status: Never Used  Substance Use Topics   Alcohol use: No   Drug use: Never     ROS   Objective:   Vitals: BP 124/87 (BP Location: Right Arm)   Pulse 100   Temp 99.1 F (37.3 C) (Oral)   Resp 16   SpO2 95%   Physical Exam Constitutional:      General: She is not in acute distress.    Appearance: Normal appearance. She is well-developed and normal weight. She is not ill-appearing, toxic-appearing or diaphoretic.  HENT:     Head: Normocephalic and atraumatic.     Right Ear: Tympanic membrane, ear canal and external ear normal. No drainage or tenderness. No middle ear effusion. There is no impacted cerumen. Tympanic membrane is not erythematous or bulging.     Left Ear: Tympanic membrane, ear canal and external ear normal. No drainage or tenderness.  No middle ear effusion. There is no impacted cerumen. Tympanic membrane is not erythematous or bulging.     Nose: Nose normal. No congestion or rhinorrhea.     Mouth/Throat:     Mouth: Mucous membranes are moist. No oral lesions.     Pharynx: No pharyngeal swelling, oropharyngeal exudate, posterior oropharyngeal erythema or uvula swelling.     Tonsils: No tonsillar exudate or tonsillar abscesses.  Eyes:     General: No scleral icterus.       Right eye: No discharge.        Left eye: No discharge.     Extraocular Movements: Extraocular movements intact.     Right eye: Normal extraocular motion.     Left eye: Normal extraocular motion.     Conjunctiva/sclera: Conjunctivae normal.  Cardiovascular:     Rate and Rhythm: Normal rate and regular rhythm.     Heart sounds: Normal heart sounds. No murmur heard.    No friction rub. No gallop.  Pulmonary:     Effort: Pulmonary effort is normal. No respiratory distress.     Breath sounds: No stridor. No wheezing, rhonchi or rales.  Chest:     Chest wall: No tenderness.  Musculoskeletal:     Cervical back: Normal range of motion and neck supple.  Lymphadenopathy:     Cervical: No cervical adenopathy.  Skin:    General: Skin is warm and dry.  Neurological:      General: No focal deficit present.     Mental Status: She is alert and oriented to person, place, and time.  Psychiatric:        Mood and Affect: Mood normal.        Behavior: Behavior normal.     Assessment and Plan :   PDMP not reviewed this encounter.  1. Viral respiratory infection   2. Mild persistent asthma without complication    Deferred imaging given clear cardiopulmonary exam, hemodynamically stable vital signs.  Patient declined COVID testing.  Must avoid steroids due to her diabetes.  Suspect viral URI, viral syndrome. Physical exam findings  reassuring and vital signs stable for discharge. Advised supportive care, offered symptomatic relief. Counseled patient on potential for adverse effects with medications prescribed/recommended today, ER and return-to-clinic precautions discussed, patient verbalized understanding.     [name] Bamberg, PA-C 06/09/23 1236

## 2023-06-09 NOTE — Discharge Instructions (Signed)
We will manage this as a viral illness. For sore throat or cough try using a honey-based tea. Use 3 teaspoons of honey with juice squeezed from half lemon. Place shaved pieces of ginger into 1/2-1 cup of water and warm over stove top. Then mix the ingredients and repeat every 4 hours as needed. Please take ibuprofen 600mg  every 6 hours with food alternating with OR taken together with Tylenol 500mg -650mg  every 6 hours for throat pain, fevers, aches and pains. Hydrate very well with at least 2 liters of water. Eat light meals such as soups (chicken and noodles, vegetable, chicken and wild rice).  Do not eat foods that you are allergic to.  Taking an antihistamine like Zyrtec (10mg  daily) can help against postnasal drainage, sinus congestion which can cause sinus pain, sinus headaches, throat pain, painful swallowing, coughing.  You can take this together with pseudoephedrine (Sudafed) at a dose of 60 mg 3 times a day or twice daily as needed for the same kind of nasal drip, congestion.  Use cough syrup as needed.

## 2023-06-09 NOTE — ED Triage Notes (Signed)
Pt reports nasal congestion, sore throat when coughing or sneezing  and chest congestion x 3 days. State she had left ear pain last night, not today.  Mucinex gives some relief with mucus.

## 2023-06-26 ENCOUNTER — Other Ambulatory Visit: Payer: Self-pay | Admitting: Internal Medicine

## 2023-07-14 ENCOUNTER — Other Ambulatory Visit (HOSPITAL_COMMUNITY): Payer: Self-pay

## 2023-07-14 ENCOUNTER — Telehealth: Payer: Self-pay

## 2023-07-14 NOTE — Telephone Encounter (Signed)
 Pharmacy Patient Advocate Encounter   Received notification from CoverMyMeds that prior authorization for Dexcom G7 sensor is required/requested.   Insurance verification completed.   The patient is insured through [facility] .   Per test claim: PA required; PA submitted to above mentioned insurance via CoverMyMeds Key/confirmation #/EOC [id number] Status is pending

## 2023-07-17 NOTE — Telephone Encounter (Signed)
 Pharmacy Patient Advocate Encounter  Received notification from [employer] that Prior Authorization for Dexcom G7 sensor has been APPROVED through 07/12/24   PA #/Case ID/Reference #: [id number]

## 2023-07-23 ENCOUNTER — Ambulatory Visit: Payer: 59 | Admitting: Internal Medicine

## 2023-07-29 ENCOUNTER — Other Ambulatory Visit: Payer: Self-pay

## 2023-07-29 ENCOUNTER — Emergency Department (HOSPITAL_BASED_OUTPATIENT_CLINIC_OR_DEPARTMENT_OTHER)
Admission: EM | Admit: 2023-07-29 | Discharge: 2023-07-29 | Disposition: A | Discharge status: Home / Self Care | Payer: 59 | Attending: Emergency Medicine | Admitting: Emergency Medicine

## 2023-07-29 DIAGNOSIS — E1065 Type 1 diabetes mellitus with hyperglycemia: Secondary | ICD-10-CM | POA: Diagnosis not present

## 2023-07-29 DIAGNOSIS — R110 Nausea: Secondary | ICD-10-CM | POA: Diagnosis present

## 2023-07-29 DIAGNOSIS — Z794 Long term (current) use of insulin: Secondary | ICD-10-CM | POA: Insufficient documentation

## 2023-07-29 DIAGNOSIS — R739 Hyperglycemia, unspecified: Secondary | ICD-10-CM

## 2023-07-29 DIAGNOSIS — R030 Elevated blood-pressure reading, without diagnosis of hypertension: Secondary | ICD-10-CM | POA: Diagnosis not present

## 2023-07-29 DIAGNOSIS — Z79899 Other long term (current) drug therapy: Secondary | ICD-10-CM | POA: Diagnosis not present

## 2023-07-29 LAB — COMPREHENSIVE METABOLIC PANEL
ALT: 14 U/L (ref 0–44)
AST: 12 U/L — ABNORMAL LOW (ref 15–41)
Albumin: 4 g/dL (ref 3.5–5.0)
Alkaline Phosphatase: 82 U/L (ref 38–126)
Anion gap: 9 (ref 5–15)
BUN: 9 mg/dL (ref 6–20)
CO2: 25 mmol/L (ref 22–32)
Calcium: 9 mg/dL (ref 8.9–10.3)
Chloride: 94 mmol/L — ABNORMAL LOW (ref 98–111)
Creatinine, Ser: 0.79 mg/dL (ref 0.44–1.00)
GFR, Estimated: >60 mL/min (ref >60)
Glucose, Bld: 516 mg/dL (ref 70–99)
Potassium: 4.2 mmol/L (ref 3.5–5.1)
Sodium: 128 mmol/L — ABNORMAL LOW (ref 135–145)
Total Bilirubin: 0.2 mg/dL (ref 0.0–1.2)
Total Protein: 7.1 g/dL (ref 6.5–8.1)

## 2023-07-29 LAB — CBC WITH DIFFERENTIAL/PLATELET
Abs Immature Granulocytes: 0.02 10*3/uL (ref 0.00–0.07)
Basophils Absolute: 0 10*3/uL (ref 0.0–0.1)
Basophils Relative: 1 %
Eosinophils Absolute: 0.1 10*3/uL (ref 0.0–0.5)
Eosinophils Relative: 1 %
HCT: 36.3 % (ref 36.0–46.0)
Hemoglobin: 11.6 g/dL — ABNORMAL LOW (ref 12.0–15.0)
Immature Granulocytes: 0 %
Lymphocytes Relative: 45 %
Lymphs Abs: 3.8 10*3/uL (ref 0.7–4.0)
MCH: 25.8 pg — ABNORMAL LOW (ref 26.0–34.0)
MCHC: 32 g/dL (ref 30.0–36.0)
MCV: 80.7 fL (ref 80.0–100.0)
Monocytes Absolute: 0.4 10*3/uL (ref 0.1–1.0)
Monocytes Relative: 5 %
Neutro Abs: 4.1 10*3/uL (ref 1.7–7.7)
Neutrophils Relative %: 48 %
Platelets: 311 10*3/uL (ref 150–400)
RBC: 4.5 MIL/uL (ref 3.87–5.11)
RDW: 13.5 % (ref 11.5–15.5)
WBC: 8.5 10*3/uL (ref 4.0–10.5)
nRBC: 0 % (ref 0.0–0.2)

## 2023-07-29 LAB — URINALYSIS, ROUTINE W REFLEX MICROSCOPIC
Bilirubin Urine: NEGATIVE
Glucose, UA: >1000 mg/dL — AB
Hgb urine dipstick: NEGATIVE
Ketones, ur: NEGATIVE mg/dL
Leukocytes,Ua: NEGATIVE
Nitrite: NEGATIVE
Protein, ur: NEGATIVE mg/dL
Specific Gravity, Urine: 1.027 (ref 1.005–1.030)
pH: 6 (ref 5.0–8.0)

## 2023-07-29 LAB — I-STAT VENOUS BLOOD GAS, ED
Acid-Base Excess: 2 mmol/L (ref 0.0–2.0)
Bicarbonate: 27.4 mmol/L (ref 20.0–28.0)
Calcium, Ion: 1.24 mmol/L (ref 1.15–1.40)
HCT: 37 % (ref 36.0–46.0)
Hemoglobin: 12.6 g/dL (ref 12.0–15.0)
O2 Saturation: 85 %
Potassium: 4.4 mmol/L (ref 3.5–5.1)
Sodium: 131 mmol/L — ABNORMAL LOW (ref 135–145)
TCO2: 29 mmol/L (ref 22–32)
pCO2, Ven: 43.5 mm[Hg] — ABNORMAL LOW (ref 44–60)
pH, Ven: 7.408 (ref 7.25–7.43)
pO2, Ven: 50 mm[Hg] — ABNORMAL HIGH (ref 32–45)

## 2023-07-29 LAB — PREGNANCY, URINE: Preg Test, Ur: NEGATIVE

## 2023-07-29 LAB — BETA-HYDROXYBUTYRIC ACID: Beta-Hydroxybutyric Acid: 0.1 mmol/L (ref 0.05–0.27)

## 2023-07-29 LAB — CBG MONITORING, ED
Glucose-Capillary: 492 mg/dL — ABNORMAL HIGH (ref 70–99)
Glucose-Capillary: 353 mg/dL — ABNORMAL HIGH (ref 70–99)
Glucose-Capillary: 226 mg/dL — ABNORMAL HIGH (ref 70–99)

## 2023-07-29 MED ORDER — SODIUM CHLORIDE 0.9 % IV BOLUS
1000.0000 mL | Freq: Once | INTRAVENOUS | Status: AC
  Administered 2023-07-29: 1000 mL via INTRAVENOUS

## 2023-07-29 MED ORDER — INSULIN ASPART 100 UNIT/ML IV SOLN
10.0000 [IU] | Freq: Once | INTRAVENOUS | Status: AC
  Administered 2023-07-29: 10 [IU] via INTRAVENOUS

## 2023-07-29 MED ORDER — INSULIN ASPART 100 UNIT/ML IV SOLN
5.0000 [IU] | Freq: Once | INTRAVENOUS | Status: AC
  Administered 2023-07-29: 5 [IU] via INTRAVENOUS

## 2023-07-29 NOTE — ED Provider Notes (Signed)
[place] EMERGENCY DEPARTMENT AT [facility] Provider Note   CSN: [id number] Arrival date & time: 07/29/23  4010     History  Chief Complaint  Patient presents with   Hyperglycemia    Brandy Parks is a 28 y.o. female.  The history is provided by the patient.  Hyperglycemia She has history of type 1 diabetes and comes in because her home Dexcom has been reading high since 5 PM.  She has noted increased thirst and increased urination as well as mild nausea and she is concerned that she might be developing early ketoacidosis.  She denies fever or chills.  She denies vomiting.    Home Medications Prior to Admission medications   Medication Sig Start Date End Date Taking? Authorizing Provider  ACCU-CHEK FASTCLIX LANCETS MISC 1 each by Does not apply route as directed. Check sugar 6 x daily 06/23/13   [name], MD  amLODipine (NORVASC) 10 MG tablet Take 10 mg by mouth daily. Patient not taking: Reported on 02/13/2023    [provider]  cetirizine (ZYRTEC ALLERGY) 10 MG tablet Take 1 tablet (10 mg total) by mouth daily. 06/09/23   [name] Bamberg, PA-C  Continuous Blood Gluc Transmit (DEXCOM G6 TRANSMITTER) MISC USE AS DIRECTED 04/29/22   Shamleffer, Konrad Dolores, MD  Continuous Glucose Sensor (DEXCOM G7 SENSOR) MISC 1 Device by Does not apply route as directed. 04/29/23   Shamleffer, Konrad Dolores, MD  dextromethorphan-guaiFENesin (MUCINEX DM) 30-600 MG 12hr tablet Take 1 tablet by mouth 2 (two) times daily.    [provider]  escitalopram (LEXAPRO) 10 MG tablet Take by mouth. 05/25/23   [provider]  fluticasone (FLONASE) 50 MCG/ACT nasal spray Place 1-2 sprays into both nostrils daily for 7 days. 05/21/18 02/13/23  Wieters, Hallie C, PA-C  glucagon 1 MG injection Use for Severe Hypoglycemia . Inject 1mg  intramuscularly if unresponsive, unable to swallow, unconscious and/or has seizure 04/28/19   Gretchen Short, NP  glucose blood test  strip Up to 6 checks per day 09/26/20   Gretchen Short, NP  hydrochlorothiazide (HYDRODIURIL) 25 MG tablet Take 1 tablet (25 mg total) by mouth daily as needed. 01/24/22   Geoffery Lyons, MD  ibuprofen (ADVIL) 800 MG tablet Take 1 tablet (800 mg total) by mouth every 8 (eight) hours as needed. Patient not taking: Reported on 02/13/2023 04/03/19   Mickie Bail, NP  lisinopril (ZESTRIL) 2.5 MG tablet Take 10 mg by mouth daily.    [provider]  norgestimate-ethinyl estradiol (ORTHO-CYCLEN) 0.25-35 MG-MCG tablet Take by mouth. 04/18/23   [provider]  NOVOLOG 100 UNIT/ML injection Inject a maximum dose of 100 units subcutaneously as directed 06/28/23   Shamleffer, Konrad Dolores, MD  promethazine-dextromethorphan (PROMETHAZINE-DM) 6.25-15 MG/5ML syrup Take 5 mLs by mouth 3 (three) times daily as needed for cough. 06/09/23   [name] Bamberg, PA-C  pseudoephedrine (SUDAFED) 60 MG tablet Take 1 tablet (60 mg total) by mouth every 8 (eight) hours as needed for congestion. 06/09/23   [name] Bamberg, PA-C  VIENVA 0.1-20 MG-MCG tablet TK 1 T PO QD Patient not taking: Reported on 02/13/2023 04/10/19   [provider]      Allergies    Patient has no known allergies.    Review of Systems   Review of Systems  All other systems reviewed and are negative.   Physical Exam Updated Vital Signs BP (!) 146/97 (BP Location: Right Arm)   Pulse 87   Temp 98 F (36.7 C) (Oral)  Resp 16   Ht 4\' 10"  (1.473 m)   Wt 83.9 kg   LMP 07/29/2023 (Exact Date)   SpO2 98%   BMI 38.67 kg/m  Physical Exam Vitals and nursing note reviewed.   28 year old female, resting comfortably and in no acute distress. Vital signs are significant for elevated blood pressure9. Oxygen saturation is 98%, which is normal. Head is normocephalic and atraumatic. PERRLA, EOMI. Oropharynx is clear. Neck is nontender and supple without adenopathy. Lungs are clear without rales, wheezes, or rhonchi. Chest is  nontender. Heart has regular rate and rhythm without murmur. Abdomen is soft, flat, nontender. Extremities have no cyanosis or edema, full range of motion is present. Skin is warm and dry without rash. Neurologic: Mental status is normal, cranial nerves are intact, moves all extremities equally.  ED Results / Procedures / Treatments   Labs (all labs ordered are listed, but only abnormal results are displayed) Labs Reviewed  URINALYSIS, ROUTINE W REFLEX MICROSCOPIC - Abnormal; Notable for the following components:      Result Value   Color, Urine COLORLESS (*)    Glucose, UA >1,000 (*)    Bacteria, UA RARE (*)    All other components within normal limits  COMPREHENSIVE METABOLIC PANEL - Abnormal; Notable for the following components:   Sodium 128 (*)    Chloride 94 (*)    Glucose, Bld 516 (*)    AST 12 (*)    All other components within normal limits  CBC WITH DIFFERENTIAL/PLATELET - Abnormal; Notable for the following components:   Hemoglobin 11.6 (*)    MCH 25.8 (*)    All other components within normal limits  CBG MONITORING, ED - Abnormal; Notable for the following components:   Glucose-Capillary 492 (*)    All other components within normal limits  CBG MONITORING, ED - Abnormal; Notable for the following components:   Glucose-Capillary 353 (*)    All other components within normal limits  I-STAT VENOUS BLOOD GAS, ED - Abnormal; Notable for the following components:   pCO2, Ven 43.5 (*)    pO2, Ven 50 (*)    Sodium 131 (*)    All other components within normal limits  CBG MONITORING, ED - Abnormal; Notable for the following components:   Glucose-Capillary 226 (*)    All other components within normal limits  PREGNANCY, URINE  BETA-HYDROXYBUTYRIC ACID  CBG MONITORING, ED   Procedures Procedures    Medications Ordered in ED Medications  sodium chloride 0.9 % bolus 1,000 mL (0 mLs Intravenous Stopped 07/29/23 0351)  insulin aspart (novoLOG) injection 10 Units (10  Units Intravenous Given 07/29/23 0344)  insulin aspart (novoLOG) injection 5 Units (5 Units Intravenous Given 07/29/23 0438)    ED Course/ Medical Decision Making/ A&P                                 Medical Decision Making Amount and/or Complexity of Data Reviewed Labs: ordered.  Risk OTC drugs.   Elevated glucose and patient with type 1 diabetes.  I reviewed her past records, and last admission for hyperglycemia was 08/24/2015-08/25/2015, last admission for ketoacidosis was 11/17/2011-11/19/2011.  Capillary blood glucose was significantly elevated at 492.  I have ordered laboratory workup including CBC, comprehensive metabolic panel, venous blood gas, beta hydroxybutyrate level.  I have reviewed her laboratory tests, and my interpretation is normal pH-no evidence of ketoacidosis, hyperglycemia with hyponatremia commensurate with degree of hyperglycemia-no  evidence of hyperosmolar state or ketoacidosis, mild anemia which is stable, normal beta hydroxybutyrate.  Urinalysis shows no evidence of UTI, glycosuria present as expected but no ketones.  With normal anion gap, normal pH, and no ketones in urine, ketoacidosis is ruled out.  I ordered a dose of insulin.  Following IV fluids and insulin, glucose has come down to 353.  I ordered additional IV insulin, and glucose is down to 226.  I feel patient is safe for discharge at this point.  She is to resume her usual glycemic program at home.  CRITICAL CARE Performed by: Dione Booze Total critical care time: 45 minutes Critical care time was exclusive of separately billable procedures and treating other patients. Critical care was necessary to treat or prevent imminent or life-threatening deterioration. Critical care was time spent personally by me on the following activities: development of treatment plan with patient and/or surrogate as well as nursing, discussions with consultants, evaluation of patient's response to treatment, examination of  patient, obtaining history from patient or surrogate, ordering and performing treatments and interventions, ordering and review of laboratory studies, ordering and review of radiographic studies, pulse oximetry and re-evaluation of patient's condition.  Final Clinical Impression(s) / ED Diagnoses Final diagnoses:  Hyperglycemia    Rx / DC Orders ED Discharge Orders     None         Dione Booze, MD 07/29/23 [phone]

## 2023-07-29 NOTE — ED Triage Notes (Signed)
PT POV from home reporting dexcom reading "high". Also reports urinary frequency.

## 2023-08-27 ENCOUNTER — Ambulatory Visit: Payer: 59 | Admitting: Internal Medicine

## 2023-08-31 ENCOUNTER — Telehealth: Payer: Self-pay

## 2023-08-31 MED ORDER — NOVOLOG FLEXPEN 100 UNIT/ML ~~LOC~~ SOPN: PEN_INJECTOR | SUBCUTANEOUS | 1 refills | Status: DC

## 2023-08-31 MED ORDER — INSULIN PEN NEEDLE 32G X 4 MM MISC: 1.0000 | Freq: Four times a day (QID) | 3 refills | Status: DC

## 2023-08-31 MED ORDER — LANTUS SOLOSTAR 100 UNIT/ML ~~LOC~~ SOPN: 46.0000 [IU] | PEN_INJECTOR | Freq: Every day | SUBCUTANEOUS | 1 refills | Status: DC

## 2023-08-31 NOTE — Telephone Encounter (Signed)
 Patient needs to have insulin pens sent to pharmacy until her new pump arrives.

## 2023-08-31 NOTE — Telephone Encounter (Signed)
 Pt has been notified and voices understanding.

## 2023-09-10 ENCOUNTER — Encounter: Payer: Self-pay | Admitting: Internal Medicine

## 2023-09-15 ENCOUNTER — Ambulatory Visit: Payer: 59 | Admitting: Internal Medicine

## 2023-09-15 ENCOUNTER — Telehealth: Payer: Self-pay | Admitting: Nutrition

## 2023-09-15 ENCOUNTER — Encounter: Payer: Self-pay | Admitting: Internal Medicine

## 2023-09-15 VITALS — BP 124/76 | HR 88 | Ht <= 58 in | Wt 197.4 lb

## 2023-09-15 DIAGNOSIS — E1065 Type 1 diabetes mellitus with hyperglycemia: Secondary | ICD-10-CM | POA: Diagnosis not present

## 2023-09-15 LAB — POCT GLYCOSYLATED HEMOGLOBIN (HGB A1C): Hemoglobin A1C: 9.9 % — AB (ref 4.0–5.6)

## 2023-09-15 NOTE — Telephone Encounter (Signed)
 Patient received a new pump.  She was unlinked to her tandem and her new pump was relinked to Tandem and Source, and her Control IQ was turned on and all settings were rechecked for accuracy.  Time was wrong and this was corrected.

## 2023-09-15 NOTE — Patient Instructions (Signed)

## 2023-09-15 NOTE — Progress Notes (Unsigned)
 Name: Brandy Parks  Age/ Sex: 28 y.o., female   MRN/ DOB: [id number], 1996/04/06     PCP: Larkin Ina   Reason for Endocrinology Evaluation: Type 1 Diabetes Mellitus  Initial Endocrine Consultative Visit: 12/26/2020    PATIENT IDENTIFIER: [name] is a 28 y.o. female with a past medical history of T1DM. The patient has followed with Endocrinology clinic since 12/26/2020 for consultative assistance with management of her diabetes.  DIABETIC HISTORY:  [name] was diagnosed with DM in 2012 at age 30, has been on an insulin pump for years, initially was on medtronic subsequently switched to T-slim~ 2019. Her hemoglobin A1c has ranged from 7.2% in 2012, peaking at 12.0% in 2017. She finished cosmetology school in 2018    Ozempic caused vomiting 10/2022   SUBJECTIVE:   During the last visit (09/23/2022): A1c 8.5%    Today (09/15/2023): [name] is here for a follow up on diabetes.  She checks her blood sugars multiple  times daily. The patient endorses decreased frequency of hypoglycemic episodes , she assures me consistent bolus each time she eats  Patient presented to the ED with hyperglycemia 07/2023 with serum glucose of 516 mg/DL, no DKA.  She did have a malfunctioning insulin pump, currently has a new pump  She continues with nausea but no vomiting  Denies heartburn  Recently noted constipation    Pump and meter download:   T:slim     Basal rate I: C ratio SF BG target  0000 1.670 15 50 150  0300 1.100 15 50 150  0600 1.700 8 50 120  0800 2.0 6.5 30 120  1000 2.2 6.0 25 120  1700 2.0 5.5 25 120  2100 2.0 5.5 30 120                    Type & Model of Pump: T-slim  Insulin Type: Currently using Novolog .    PUMP STATISTICS: Unable to download      CONTINUOUS GLUCOSE MONITORING RECORD INTERPRETATION    Dates of Recording: 2/26-3/05/2024  Sensor description: dexcom  Results statistics:   CGM use % of time 78  Average and SD  275/102  Time in range 22 %  % Time Above 180 21  % Time above 250 57  % Time Below target 0   Glycemic patterns summary: BGs are high throughout the day and night  Hyperglycemic episodes postprandial  Hypoglycemic episodes occurred N/A  Overnight periods: High    HOME DIABETES REGIMEN:  Novolog    Statin: no ACE-I/ARB: yes     DIABETIC COMPLICATIONS: Microvascular complications:   Denies: CKD, retinopathy, neuropathy Last Eye Exam: Completed 07/19/2021  Macrovascular complications:   Denies: CAD, CVA, PVD   HISTORY:  Past Medical History:  Past Medical History:  Diagnosis Date   Acanthosis nigricans, acquired    Diabetes mellitus    Hypertension    Obesity    Past Surgical History: No past surgical history on file. Social History:  reports that she has never smoked. She has never used smokeless tobacco. She reports that she does not drink alcohol and does not use drugs. Family History:  Family History  Problem Relation Age of Onset   Obesity Mother    Hypothyroidism Mother    Hypertension Mother    Obesity Maternal Aunt    Hypertension Maternal Aunt    Obesity Maternal Grandmother    Hypertension Maternal Grandmother    Kidney disease Maternal Grandmother  Heart disease Maternal Grandfather    Hypertension Maternal Grandfather    Cancer Paternal Grandfather    Diabetes Maternal Uncle    Healthy Father      HOME MEDICATIONS: Allergies as of 09/15/2023   No Known Allergies      Medication List        Accurate as of September 15, 2023  8:13 AM. If you have any questions, ask your nurse or doctor.          Accu-Chek FastClix Lancets Misc 1 each by Does not apply route as directed. Check sugar 6 x daily   amLODipine 10 MG tablet Commonly known as: NORVASC Take 10 mg by mouth daily.   cetirizine 10 MG tablet Commonly known as: ZyrTEC Allergy Take 1 tablet (10 mg total) by mouth daily.   Dexcom G6 Transmitter Misc USE AS DIRECTED    Dexcom G7 Sensor Misc 1 Device by Does not apply route as directed.   dextromethorphan-guaiFENesin 30-600 MG 12hr tablet Commonly known as: MUCINEX DM Take 1 tablet by mouth 2 (two) times daily.   doxepin 10 MG capsule Commonly known as: SINEQUAN Take 10 mg by mouth at bedtime.   escitalopram 10 MG tablet Commonly known as: LEXAPRO Take by mouth.   fluticasone 50 MCG/ACT nasal spray Commonly known as: FLONASE Place 1-2 sprays into both nostrils daily for 7 days.   glucagon 1 MG injection Use for Severe Hypoglycemia . Inject 1mg  intramuscularly if unresponsive, unable to swallow, unconscious and/or has seizure   glucose blood test strip Up to 6 checks per day   hydrochlorothiazide 25 MG tablet Commonly known as: HYDRODIURIL Take 1 tablet (25 mg total) by mouth daily as needed.   Insulin Pen Needle 32G X 4 MM Misc 1 Device by Does not apply route in the morning, at noon, in the evening, and at bedtime.   Lantus SoloStar 100 UNIT/ML Solostar Pen Generic drug: insulin glargine Inject 46 Units into the skin daily.   lisinopril 2.5 MG tablet Commonly known as: ZESTRIL Take 10 mg by mouth daily.   lisinopril 10 MG tablet Commonly known as: ZESTRIL Take 10 mg by mouth daily.   norgestimate-ethinyl estradiol 0.25-35 MG-MCG tablet Commonly known as: ORTHO-CYCLEN Take by mouth.   NovoLOG 100 UNIT/ML injection Generic drug: insulin aspart Inject a maximum dose of 100 units subcutaneously as directed   NovoLOG FlexPen 100 UNIT/ML FlexPen Generic drug: insulin aspart Max daily 45 units   promethazine-dextromethorphan 6.25-15 MG/5ML syrup Commonly known as: PROMETHAZINE-DM Take 5 mLs by mouth 3 (three) times daily as needed for cough.   pseudoephedrine 60 MG tablet Commonly known as: SUDAFED Take 1 tablet (60 mg total) by mouth every 8 (eight) hours as needed for congestion.   valACYclovir 500 MG tablet Commonly known as: VALTREX Take 500 mg by mouth daily.    Vienva 0.1-20 MG-MCG tablet Generic drug: levonorgestrel-ethinyl estradiol TK 1 T PO QD         OBJECTIVE:   Vital Signs: There were no vitals taken for this visit.  Wt Readings from Last 3 Encounters:  07/29/23 185 lb (83.9 kg)  02/13/23 197 lb 3.2 oz (89.4 kg)  09/23/22 199 lb 12.8 oz (90.6 kg)     Exam: General: Pt appears well and is in NAD  Lungs: Clear with good BS bilat   Heart: RRR   Abdomen: Soft, non tender   Extremities: No pretibial edema.  Neuro: MS is good with appropriate affect, pt is alert and Ox3  DM foot exam: 02/13/2023  The skin of the feet is intact without sores or ulcerations. The pedal pulses are 2+ on right and 2+ on left. The sensation is intact to a screening 5.07, 10 gram monofilament bilaterally      DATA REVIEWED:  Lab Results  Component Value Date   HGBA1C 8.8 (A) 02/13/2023   HGBA1C 7.6 (A) 08/23/2021   HGBA1C 7.5 (A) 03/28/2021    Latest Reference Range & Units 07/29/23 02:40  Sodium 135 - 145 mmol/L 128 (L)  Potassium 3.5 - 5.1 mmol/L 4.2  Chloride 98 - 111 mmol/L 94 (L)  CO2 22 - 32 mmol/L 25  Glucose 70 - 99 mg/dL 161 (HH)  BUN 6 - 20 mg/dL 9  Creatinine 0.96 - 0.45 mg/dL 4.09  Calcium 8.9 - 81.1 mg/dL 9.0  Anion gap 5 - 15  9  Alkaline Phosphatase 38 - 126 U/L 82  Albumin 3.5 - 5.0 g/dL 4.0  AST 15 - 41 U/L 12 (L)  ALT 0 - 44 U/L 14  Total Protein 6.5 - 8.1 g/dL 7.1  Total Bilirubin 0.0 - 1.2 mg/dL 0.2  GFR, Estimated >91 mL/min >60     ASSESSMENT / PLAN / RECOMMENDATIONS:   1) Type 1 Diabetes Mellitus, poorly controlled, With out complications - Most recent A1c of 9.9 %. Goal A1c < 7.0 %.    -Patient continues with poorly controlled diabetes -Intolerant to Trulicity - Intolerant to ozempic  - Declines  Metformin - We were not able to download her pump but I was manually able to review it and the CGM download .  I have adjusted her basal rate and sensitivity factor as below -Patient advised that the  IQ- mode needs to be on    MEDICATIONS: NovoLog        Basal rate I: C ratio SF BG target  0000 1.670 15 45 150  0300 1.100 15 45 150  0600 2.00 8 45 120  0800 2.0 6.5 25 120  1000 2.1 6.5 25 120  1700 2.0 5.5 25 120  2100 2.0 5.5 25 120                      EDUCATION / INSTRUCTIONS: BG monitoring instructions: Patient is instructed to check her blood sugars 3 times a day, before meals . Call [facility] if: BG persistently < 70  I reviewed the Rule of 15 for the treatment of hypoglycemia in detail with the patient. Literature supplied.    2) Diabetic complications:  Eye: Does not have known diabetic retinopathy.  Neuro/ Feet: Does not have known diabetic peripheral neuropathy .  Renal: Patient does not have known baseline CKD. She   is  on an ACEI/ARB at present.      F/U in 4 months   Signed electronically by: Lyndle Herrlich, MD  [facility] End[facility]  [facility] [address] [place], [place] [address] Phone: [phone] FAX: [phone]   CC: Larkin Ina [address] [place] [address] Phone: [phone]  Fax: [phone]  Return to Endocrinology clinic as below: No future appointments.

## 2023-10-13 ENCOUNTER — Encounter (INDEPENDENT_AMBULATORY_CARE_PROVIDER_SITE_OTHER): Payer: Self-pay

## 2023-10-15 ENCOUNTER — Ambulatory Visit
Admission: RE | Admit: 2023-10-15 | Discharge: 2023-10-15 | Disposition: A | Discharge status: Home / Self Care | Source: Ambulatory Visit | Attending: Family Medicine | Admitting: Family Medicine

## 2023-10-15 VITALS — BP 137/98 | HR 99 | Temp 98.7°F | Resp 16

## 2023-10-15 DIAGNOSIS — J4521 Mild intermittent asthma with (acute) exacerbation: Secondary | ICD-10-CM

## 2023-10-15 DIAGNOSIS — B9689 Other specified bacterial agents as the cause of diseases classified elsewhere: Secondary | ICD-10-CM

## 2023-10-15 DIAGNOSIS — J329 Chronic sinusitis, unspecified: Secondary | ICD-10-CM

## 2023-10-15 MED ORDER — FLUCONAZOLE 150 MG PO TABS: 150.0000 mg | ORAL_TABLET | Freq: Every day | ORAL | 0 refills | Status: AC

## 2023-10-15 MED ORDER — AMOXICILLIN-POT CLAVULANATE 875-125 MG PO TABS: 1.0000 | ORAL_TABLET | Freq: Two times a day (BID) | ORAL | 0 refills | Status: AC

## 2023-10-15 NOTE — Discharge Instructions (Signed)
 Continue your allergy medicine, nasal spray, and albuterol inhaler as needed.  Start Augmentin twice daily for 7 days.  Diflucan as prescribed to help prevent antibiotic induced yeast infection.  You may do nasal rinses such as Nettie Potts as tolerated.  Lots of rest and fluids.  Please follow-up with your PCP in 2 to 3 days for recheck.  Please go to the ER if you develop any worsening symptoms.  Hope you feel better soon!

## 2023-10-15 NOTE — ED Provider Notes (Addendum)
 [facility]    CSN: [id number] Arrival date & time: 10/15/23  1428      History   Chief Complaint Chief Complaint  Patient presents with   Cough    I've been having chest pain when I cough, my nose is completely stopped up, I've been waking up with pink eye symptoms like crust and redness, and I also have headaches. - Entered by patient   Eye Problem    HPI Brandy Parks is a 28 y.o. female  presents for evaluation of URI symptoms for 2 weeks. Patient reports associated symptoms of sinus pressure/pain with purulent nasal discharge, headaches, postnasal drip with a dry cough, wheezing. Denies N/V/D, sore throat, fevers, body aches, shortness of breath. Patient does have a hx of asthma.  Has been using her inhaler with improvement in symptoms.  Patient is not an active smoker.   Reports no sick contacts.  Pt has taken Mucinex, allergy medicine, nasal spray OTC for symptoms. Pt has no other concerns at this time.    Cough Associated symptoms: headaches and wheezing   Eye Problem Associated symptoms: headaches     Past Medical History:  Diagnosis Date   Acanthosis nigricans, acquired    Diabetes mellitus    Hypertension    Obesity     Patient Active Problem List   Diagnosis Date Noted   Acanthosis nigricans, acquired 08/09/2019   Hyperglycemia 04/13/2018   Elevated hemoglobin A1c 04/13/2018   Benign essential hypertension 08/30/2017   Hypertension associated with type 1 diabetes mellitus (HCC) 08/30/2017   Maladaptive health behaviors affecting medical condition 09/25/2015   Type I diabetes mellitus, uncontrolled 01/10/2015   Skin abscess 12/06/2014   Insulin pump titration 02/08/2014   Obesity 10/30/2010   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) 10/30/2010    History reviewed. No pertinent surgical history.  OB History   No obstetric history on file.      Home Medications     Prior to Admission medications   Medication Sig Start Date End Date Taking? Authorizing Provider  amoxicillin-clavulanate (AUGMENTIN) 875-125 MG tablet Take 1 tablet by mouth every 12 (twelve) hours. 10/15/23  Yes Radford Pax, NP  fluconazole (DIFLUCAN) 150 MG tablet Take 1 tablet (150 mg total) by mouth daily. Take 1 tablet on day 3 of antibiotics and the second tablet on day 7 10/15/23  Yes Radford Pax, NP  ACCU-CHEK FASTCLIX LANCETS MISC 1 each by Does not apply route as directed. Check sugar 6 x daily 06/23/13   [name], MD  amLODipine (NORVASC) 10 MG tablet Take 10 mg by mouth daily. Patient not taking: Reported on 09/15/2023    [provider]  cetirizine (ZYRTEC ALLERGY) 10 MG tablet Take 1 tablet (10 mg total) by mouth daily. 06/09/23   [name] Bamberg, PA-C  Continuous Blood Gluc Transmit (DEXCOM G6 TRANSMITTER) MISC USE AS DIRECTED 04/29/22   Shamleffer, Konrad Dolores, MD  Continuous Glucose Sensor (DEXCOM G7 SENSOR) MISC 1 Device by Does not apply route as directed. 04/29/23   Shamleffer, Konrad Dolores, MD  dextromethorphan-guaiFENesin (MUCINEX DM) 30-600 MG 12hr tablet Take 1 tablet by mouth 2 (two) times daily.    [provider]  doxepin (SINEQUAN) 10 MG capsule Take 10 mg by mouth at bedtime.    [provider]  escitalopram (LEXAPRO) 10 MG tablet Take by mouth. 05/25/23   [provider]  fluticasone (FLONASE) 50 MCG/ACT  nasal spray Place 1-2 sprays into both nostrils daily for 7 days. 05/21/18 02/13/23  Wieters, Hallie C, PA-C  glucagon 1 MG injection Use for Severe Hypoglycemia . Inject 1mg  intramuscularly if unresponsive, unable to swallow, unconscious and/or has seizure 04/28/19   Gretchen Short, NP  glucose blood test strip Up to 6 checks per day 09/26/20   Gretchen Short, NP  hydrochlorothiazide (HYDRODIURIL) 25 MG tablet Take 1 tablet (25 mg total) by mouth daily as needed. 01/24/22   Geoffery Lyons, MD  insulin aspart (NOVOLOG  FLEXPEN) 100 UNIT/ML FlexPen Max daily 45 units 08/31/23   Shamleffer, Konrad Dolores, MD  insulin glargine (LANTUS SOLOSTAR) 100 UNIT/ML Solostar Pen Inject 46 Units into the skin daily. 08/31/23   Shamleffer, Konrad Dolores, MD  Insulin Pen Needle 32G X 4 MM MISC 1 Device by Does not apply route in the morning, at noon, in the evening, and at bedtime. 08/31/23   Shamleffer, Konrad Dolores, MD  lisinopril (ZESTRIL) 10 MG tablet Take 10 mg by mouth daily. 07/13/23   [provider]  lisinopril (ZESTRIL) 2.5 MG tablet Take 10 mg by mouth daily.    [provider]  norgestimate-ethinyl estradiol (ORTHO-CYCLEN) 0.25-35 MG-MCG tablet Take by mouth. 04/18/23   [provider]  NOVOLOG 100 UNIT/ML injection Inject a maximum dose of 100 units subcutaneously as directed 06/28/23   Shamleffer, Konrad Dolores, MD  promethazine-dextromethorphan (PROMETHAZINE-DM) 6.25-15 MG/5ML syrup Take 5 mLs by mouth 3 (three) times daily as needed for cough. 06/09/23   [name] Bamberg, PA-C  pseudoephedrine (SUDAFED) 60 MG tablet Take 1 tablet (60 mg total) by mouth every 8 (eight) hours as needed for congestion. 06/09/23   [name] Bamberg, PA-C  valACYclovir (VALTREX) 500 MG tablet Take 500 mg by mouth daily. 05/14/23   [provider]  VIENVA 0.1-20 MG-MCG tablet  04/10/19   [provider]    Family History Family History  Problem Relation Age of Onset   Obesity Mother    Hypothyroidism Mother    Hypertension Mother    Obesity Maternal Aunt    Hypertension Maternal Aunt    Obesity Maternal Grandmother    Hypertension Maternal Grandmother    Kidney disease Maternal Grandmother    Heart disease Maternal Grandfather    Hypertension Maternal Grandfather    Cancer Paternal Grandfather    Diabetes Maternal Uncle    Healthy Father     Social History Social History   Tobacco Use   Smoking status: Never   Smokeless tobacco: Never  Vaping Use   Vaping status: Never Used   Substance Use Topics   Alcohol use: No   Drug use: Never     Allergies   Patient has no known allergies.   Review of Systems Review of Systems  HENT:  Positive for congestion, postnasal drip, sinus pressure and sinus pain.   Respiratory:  Positive for cough and wheezing.   Neurological:  Positive for headaches.     Physical Exam Triage Vital Signs ED Triage Vitals  Encounter Vitals Group     BP 10/15/23 1500 (!) 163/103     Systolic BP Percentile --      Diastolic BP Percentile --      Pulse Rate 10/15/23 1500 99     Resp 10/15/23 1500 16     Temp 10/15/23 1500 98.7 F (37.1 C)     Temp Source 10/15/23 1500 Oral     SpO2 10/15/23 1500 95 %     Weight --  Height --      Head Circumference --      Peak Flow --      Pain Score 10/15/23 1459 0     Pain Loc --      Pain Education --      Exclude from Growth Chart --    No data found.  Updated Vital Signs BP (!) 137/98   Pulse 99   Temp 98.7 F (37.1 C) (Oral)   Resp 16   LMP 09/30/2023 (Exact Date)   SpO2 95%   Visual Acuity Right Eye Distance:   Left Eye Distance:   Bilateral Distance:    Right Eye Near:   Left Eye Near:    Bilateral Near:     Physical Exam Vitals and nursing note reviewed.  Constitutional:      General: She is not in acute distress.    Appearance: She is well-developed. She is not ill-appearing.  HENT:     Head: Normocephalic and atraumatic.     Right Ear: Tympanic membrane and ear canal normal.     Left Ear: Tympanic membrane and ear canal normal.     Nose: Congestion present.     Right Turbinates: Not swollen or pale.     Left Turbinates: Swollen and pale.     Right Sinus: Maxillary sinus tenderness present.     Left Sinus: Maxillary sinus tenderness present.     Mouth/Throat:     Mouth: Mucous membranes are moist.     Pharynx: Oropharynx is clear. Uvula midline. Postnasal drip present. No oropharyngeal exudate or posterior oropharyngeal erythema.     Tonsils: No  tonsillar exudate or tonsillar abscesses.  Eyes:     Conjunctiva/sclera: Conjunctivae normal.     Pupils: Pupils are equal, round, and reactive to light.  Cardiovascular:     Rate and Rhythm: Normal rate and regular rhythm.     Heart sounds: Normal heart sounds.  Pulmonary:     Effort: Pulmonary effort is normal.     Breath sounds: Normal breath sounds. No wheezing, rhonchi or rales.  Musculoskeletal:     Cervical back: Normal range of motion and neck supple.  Lymphadenopathy:     Cervical: No cervical adenopathy.  Skin:    General: Skin is warm and dry.  Neurological:     General: No focal deficit present.     Mental Status: She is alert and oriented to person, place, and time.  Psychiatric:        Mood and Affect: Mood normal.        Behavior: Behavior normal.      UC Treatments / Results  Labs (all labs ordered are listed, but only abnormal results are displayed) Labs Reviewed - No data to display  EKG   Radiology No results found.  Procedures Procedures (including critical care time)  Medications Ordered in UC Medications - No data to display  Initial Impression / Assessment and Plan / UC Course  I have reviewed the triage vital signs and the nursing notes.  Pertinent labs & imaging results that were available during my care of the patient were reviewed by me and considered in my medical decision making (see chart for details).     Reviewed exam and symptoms with patient.  No red flags.  Will start Augmentin for sinusitis.  Patient reports antibiotic induced yeast infections, Diflucan as prescribed.  She will continue her allergy medicine, Flonase, and albuterol inhaler.  She declines prednisone.  Discussed rest fluids and  PCP follow-up 2 to 3 days for recheck.  ER precautions reviewed and patient verbalized understanding. Final Clinical Impressions(s) / UC Diagnoses   Final diagnoses:  Bacterial sinusitis  Mild intermittent asthma with acute exacerbation      Discharge Instructions      Continue your allergy medicine, nasal spray, and albuterol inhaler as needed.  Start Augmentin twice daily for 7 days.  Diflucan as prescribed to help prevent antibiotic induced yeast infection.  You may do nasal rinses such as Nettie Potts as tolerated.  Lots of rest and fluids.  Please follow-up with your PCP in 2 to 3 days for recheck.  Please go to the ER if you develop any worsening symptoms.  Hope you feel better soon!    ED Prescriptions     Medication Sig Dispense Auth. Provider   amoxicillin-clavulanate (AUGMENTIN) 875-125 MG tablet Take 1 tablet by mouth every 12 (twelve) hours. 14 tablet Radford Pax, NP   fluconazole (DIFLUCAN) 150 MG tablet Take 1 tablet (150 mg total) by mouth daily. Take 1 tablet on day 3 of antibiotics and the second tablet on day 7 2 tablet Radford Pax, NP      PDMP not reviewed this encounter.   Radford Pax, NP 10/15/23 1515    Radford Pax, NP 10/15/23 [phone]

## 2023-10-15 NOTE — ED Triage Notes (Signed)
 Pt present with c/o nasal congestion, headaches and sinus pressure x 2 wks States she has season allergies and the pollen has affected her eyes. Pt reports a cough that started this morning.

## 2023-10-26 ENCOUNTER — Encounter (INDEPENDENT_AMBULATORY_CARE_PROVIDER_SITE_OTHER): Payer: Self-pay

## 2023-12-16 ENCOUNTER — Ambulatory Visit: Admitting: Internal Medicine

## 2023-12-22 ENCOUNTER — Ambulatory Visit: Admitting: Internal Medicine

## 2023-12-22 ENCOUNTER — Encounter: Payer: Self-pay | Admitting: Internal Medicine

## 2023-12-22 VITALS — BP 126/82 | HR 98 | Ht <= 58 in | Wt 195.0 lb

## 2023-12-22 DIAGNOSIS — E1065 Type 1 diabetes mellitus with hyperglycemia: Secondary | ICD-10-CM

## 2023-12-22 LAB — POCT GLYCOSYLATED HEMOGLOBIN (HGB A1C): Hemoglobin A1C: 9.4 % — AB (ref 4.0–5.6)

## 2023-12-22 MED ORDER — FREESTYLE LIBRE 2 PLUS SENSOR MISC: 1.0000 | 3 refills | Status: AC

## 2023-12-22 NOTE — Patient Instructions (Signed)

## 2023-12-22 NOTE — Progress Notes (Signed)
 Name: Brandy Parks  Age/ Sex: 28 y.o., female   MRN/ DOB: [id number], 07-03-1996     PCP: Almetta Jacquet   Reason for Endocrinology Evaluation: Type 1 Diabetes Mellitus  Initial Endocrine Consultative Visit: 12/26/2020    PATIENT IDENTIFIER: [name] is a 28 y.o. female with a past medical history of T1DM. The patient has followed with Endocrinology clinic since 12/26/2020 for consultative assistance with management of her diabetes.    DIABETIC HISTORY:  [name] was diagnosed with DM in 2012 at age 89, has been on an insulin  pump for years, initially was on medtronic subsequently switched to T-slim~ 2019. Her hemoglobin A1c has ranged from 7.2% in 2012, peaking at 12.0% in 2017. She finished cosmetology school in 2018    Ozempic  caused vomiting 10/2022   SUBJECTIVE:   During the last visit (09/15/2023): A1c 9.9 %    Today (12/22/2023): [name] is here for a follow up on diabetes.  She has not been using the Dexcom due to cost .  She has been using fingerstick.  She had a follow-up with PCP 10/2023, she is on phentermine for weight management, she is on alprazolam for anxiety   Denies nausea or vomiting  Has noted occasional constipation    Pump and meter download:   T:slim        Basal rate I: C ratio SF BG target  0000 1.670 15 45 150  0300 1.100 15 45 150  0600 2.00 8 45 120  0800 2.0 6.5 25 120  1000 2.1 6.5 25 120  1700 2.0 5.5 25 120  2100 2.0 5.5 25 120                  Type & Model of Pump: T-slim  Insulin  Type: Currently using Novolog  .    PUMP STATISTICS: Average BG: 321  Average Daily Carbs (g): 99  Average Total Daily Insulin : 70.49  Average Daily Basal: 42.54 (60 %) Average Daily Bolus: 27.95 (40 %)    CONTINUOUS GLUCOSE MONITORING RECORD INTERPRETATION: N/a       HOME DIABETES REGIMEN:  Novolog     Statin: no ACE-I/ARB: yes     DIABETIC COMPLICATIONS: Microvascular complications:   Denies: CKD,  retinopathy, neuropathy Last Eye Exam: Completed 07/19/2021  Macrovascular complications:   Denies: CAD, CVA, PVD   HISTORY:  Past Medical History:  Past Medical History:  Diagnosis Date   Acanthosis nigricans, acquired    Diabetes mellitus    Hypertension    Obesity    Past Surgical History: No past surgical history on file. Social History:  reports that she has never smoked. She has never used smokeless tobacco. She reports that she does not drink alcohol and does not use drugs. Family History:  Family History  Problem Relation Age of Onset   Obesity Mother    Hypothyroidism Mother    Hypertension Mother    Obesity Maternal Aunt    Hypertension Maternal Aunt    Obesity Maternal Grandmother    Hypertension Maternal Grandmother    Kidney disease Maternal Grandmother    Heart disease Maternal Grandfather    Hypertension Maternal Grandfather    Cancer Paternal Grandfather    Diabetes Maternal Uncle    Healthy Father      HOME MEDICATIONS: Allergies as of 12/22/2023   No Known Allergies      Medication List        Accurate as of December 22, 2023  2:52 PM. If  you have any questions, ask your nurse or doctor.          Accu-Chek FastClix Lancets Misc 1 each by Does not apply route as directed. Check sugar 6 x daily   amLODipine 10 MG tablet Commonly known as: NORVASC Take 10 mg by mouth daily.   amoxicillin -clavulanate 875-125 MG tablet Commonly known as: AUGMENTIN  Take 1 tablet by mouth every 12 (twelve) hours.   cetirizine  10 MG tablet Commonly known as: ZyrTEC  Allergy Take 1 tablet (10 mg total) by mouth daily.   Dexcom G6 Transmitter Misc USE AS DIRECTED   dextromethorphan-guaiFENesin  30-600 MG 12hr tablet Commonly known as: MUCINEX  DM Take 1 tablet by mouth 2 (two) times daily.   doxepin 10 MG capsule Commonly known as: SINEQUAN Take 10 mg by mouth at bedtime.   escitalopram 10 MG tablet Commonly known as: LEXAPRO Take by mouth.    fluconazole  150 MG tablet Commonly known as: Diflucan  Take 1 tablet (150 mg total) by mouth daily. Take 1 tablet on day 3 of antibiotics and the second tablet on day 7   fluticasone  50 MCG/ACT nasal spray Commonly known as: FLONASE  Place 1-2 sprays into both nostrils daily for 7 days.   FreeStyle Libre 2 Plus Sensor Misc 1 Device by Does not apply route every 14 (fourteen) days. What changed: when to take this Changed by: Isis Costanza J Ashara Lounsbury   glucagon  1 MG injection Use for Severe Hypoglycemia . Inject 1mg  intramuscularly if unresponsive, unable to swallow, unconscious and/or has seizure   glucose blood test strip Up to 6 checks per day   hydrochlorothiazide  25 MG tablet Commonly known as: HYDRODIURIL  Take 1 tablet (25 mg total) by mouth daily as needed.   Insulin  Pen Needle 32G X 4 MM Misc 1 Device by Does not apply route in the morning, at noon, in the evening, and at bedtime.   Lantus  SoloStar 100 UNIT/ML Solostar Pen Generic drug: insulin  glargine Inject 46 Units into the skin daily.   lisinopril  2.5 MG tablet Commonly known as: ZESTRIL  Take 10 mg by mouth daily.   lisinopril  10 MG tablet Commonly known as: ZESTRIL  Take 10 mg by mouth daily.   norgestimate-ethinyl estradiol 0.25-35 MG-MCG tablet Commonly known as: ORTHO-CYCLEN Take by mouth.   NovoLOG  100 UNIT/ML injection Generic drug: insulin  aspart Inject a maximum dose of 100 units subcutaneously as directed   NovoLOG  FlexPen 100 UNIT/ML FlexPen Generic drug: insulin  aspart Max daily 45 units   promethazine -dextromethorphan 6.25-15 MG/5ML syrup Commonly known as: PROMETHAZINE -DM Take 5 mLs by mouth 3 (three) times daily as needed for cough.   pseudoephedrine  60 MG tablet Commonly known as: SUDAFED Take 1 tablet (60 mg total) by mouth every 8 (eight) hours as needed for congestion.   valACYclovir  500 MG tablet Commonly known as: VALTREX  Take 500 mg by mouth daily.   Vienva 0.1-20 MG-MCG  tablet Generic drug: levonorgestrel-ethinyl estradiol         OBJECTIVE:   Vital Signs: BP 126/82 (BP Location: Left Arm, Patient Position: Sitting, Cuff Size: Normal)   Pulse 98   Ht 4' 10 (1.473 m)   Wt 195 lb (88.5 kg)   SpO2 99%   BMI 40.76 kg/m   Wt Readings from Last 3 Encounters:  12/22/23 195 lb (88.5 kg)  09/15/23 197 lb 6.4 oz (89.5 kg)  07/29/23 185 lb (83.9 kg)     Exam: General: Pt appears well and is in NAD  Lungs: Clear with good BS bilat   Heart:  RRR   Abdomen: Soft, non tender   Extremities: No pretibial edema.  Neuro: MS is good with appropriate affect, pt is alert and Ox3     DM foot exam: 12/22/2023  The skin of the feet is intact without sores or ulcerations. The pedal pulses are 2+ on right and 2+ on left. The sensation is intact to a screening 5.07, 10 gram monofilament bilaterally      DATA REVIEWED:  Lab Results  Component Value Date   HGBA1C 9.4 (A) 12/22/2023   HGBA1C 9.9 (A) 09/15/2023   HGBA1C 8.8 (A) 02/13/2023     Labs through [facility] Every[facility] 11/04/2023 BUN 8 CR 0.68 Sodium 135 Potassium 4.2 GFR 124 A1c 9.0  Triglycerides 196 HDL 71 LDL 108   ASSESSMENT / PLAN / RECOMMENDATIONS:   1) Type 1 Diabetes Mellitus, poorly controlled, With out complications - Most recent A1c of 9.4 %. Goal A1c < 7.0 %.    -Patient continues with poorly controlled diabetes -Intolerant to Trulicity  - Intolerant to ozempic   - Declineed  Metformin - She continues to struggle with using the pump, she has not been able to obtain Dexcom sensors due to cost, her current pump is a loaner, new pump will need to pay $800 upfront - I have sent a prescription for freestyle libre 2+, to see if the pricing will be better, she understands she will have to call tandem to link her pump to the freestyle libre - We also entertained the idea of switching back to multiple daily injections of insulin  if the cost continues to be an issue - The patient  states she checks her glucose through her regular glucose meter 6 times a day, I did advise the patient that she should enter this data into the pump - I have increased her basal rate at 3 AM, as her fasting BG this morning 168 Mg/DL - I have also adjusted her insulin  to carb ratio during the day and early evening  MEDICATIONS: NovoLog         Basal rate I: C ratio SF BG target  0000 1.670 15 45 150  0300 1.200 15 45 150  0600 2.00 6.0 45 120  0800 2.0 6.0 25 120  1000 2.1 6.0 25 120  1700 2.0 5.0 25 120  2100 2.0 5.5 25 120                      EDUCATION / INSTRUCTIONS: BG monitoring instructions: Patient is instructed to check her blood sugars 3 times a day, before meals . Call [facility] if: BG persistently < 70  I reviewed the Rule of 15 for the treatment of hypoglycemia in detail with the patient. Literature supplied.    2) Diabetic complications:  Eye: Does not have known diabetic retinopathy.  Neuro/ Feet: Does not have known diabetic peripheral neuropathy .  Renal: Patient does not have known baseline CKD. She   is  on an ACEI/ARB at present.      F/U in 3 months  I spent 25 minutes preparing to see the patient by review of recent labs, imaging and procedures, obtaining and reviewing separately obtained history, communicating with the patient/family or caregiver, ordering medications, tests or procedures, and documenting clinical information in the EHR including the differential Dx, treatment, and any further evaluation and other management   Signed electronically by: Natale Bail, MD  [facility] Endocrinology  [facility] [address] [place] [place],  [place] [address] Phone: [phone] FAX: [phone]   CC: Glen Land, PA-C [address] [place] [address] Phone: [phone]  Fax: [phone]  Return to Endocrinology clinic as below: Future Appointments  Date Time Provider  Department Center  03/23/2024  8:30 AM Khristian Seals, Julian Obey, MD [facility] None

## 2023-12-23 ENCOUNTER — Encounter: Payer: Self-pay | Admitting: Internal Medicine

## 2023-12-28 ENCOUNTER — Encounter: Payer: Self-pay | Admitting: Internal Medicine

## 2023-12-28 MED ORDER — TRESIBA FLEXTOUCH 200 UNIT/ML ~~LOC~~ SOPN: 50.0000 [IU] | PEN_INJECTOR | SUBCUTANEOUS | 3 refills | Status: DC

## 2023-12-28 MED ORDER — INSULIN PEN NEEDLE 32G X 4 MM MISC: 1.0000 | Freq: Four times a day (QID) | 3 refills | Status: AC

## 2023-12-28 MED ORDER — NOVOLOG FLEXPEN 100 UNIT/ML ~~LOC~~ SOPN: PEN_INJECTOR | SUBCUTANEOUS | 3 refills | Status: DC

## 2023-12-29 ENCOUNTER — Ambulatory Visit: Admitting: Internal Medicine

## 2024-03-15 ENCOUNTER — Encounter: Payer: Self-pay | Admitting: Internal Medicine

## 2024-03-23 ENCOUNTER — Other Ambulatory Visit: Payer: Self-pay

## 2024-03-23 ENCOUNTER — Ambulatory Visit: Admitting: Internal Medicine

## 2024-03-23 MED ORDER — TRESIBA FLEXTOUCH 200 UNIT/ML ~~LOC~~ SOPN: 50.0000 [IU] | PEN_INJECTOR | SUBCUTANEOUS | 3 refills | Status: DC

## 2024-03-23 MED ORDER — NOVOLOG FLEXPEN 100 UNIT/ML ~~LOC~~ SOPN: PEN_INJECTOR | SUBCUTANEOUS | 3 refills | Status: DC

## 2024-03-23 NOTE — Progress Notes (Deleted)
 Name: Brandy Parks  Age/ Sex: 28 y.o., female   MRN/ DOB: [id number], 1996-03-22     PCP: Allen Lauraine LITTIE DEVONNA   Reason for Endocrinology Evaluation: Type 1 Diabetes Mellitus  Initial Endocrine Consultative Visit: 12/26/2020    PATIENT IDENTIFIER: Brandy Parks is a 28 y.o. female with a past medical history of T1DM. The patient has followed with Endocrinology clinic since 12/26/2020 for consultative assistance with management of her diabetes.    DIABETIC HISTORY:  Brandy Parks was diagnosed with DM in 2012 at age 40, has been on an insulin  pump for years, initially was on medtronic subsequently switched to T-slim~ 2019. Her hemoglobin A1c has ranged from 7.2% in 2012, peaking at 12.0% in 2017. She finished cosmetology school in 2018    Ozempic  caused vomiting 10/2022   We switched from insulin  pump to multiple daily injections in June, 2025 due to high cost of supplies  SUBJECTIVE:   During the last visit (12/22/2023): A1c 9.4 %    Today (03/23/2024): Brandy Parks is here for a follow up on diabetes.  She has not been using the Dexcom due to cost .  She has been using fingerstick.  She had a follow-up with PCP 10/2023, she is on phentermine for weight management, she is on alprazolam for anxiety   Denies nausea or vomiting  Has noted occasional constipation         CONTINUOUS GLUCOSE MONITORING RECORD INTERPRETATION: N/a       HOME DIABETES REGIMEN:  Tresiba  50 units daily NovoLog  1:8 TID CF: NovoLog  (BG-130/25)   Statin: no ACE-I/ARB: yes     DIABETIC COMPLICATIONS: Microvascular complications:   Denies: CKD, retinopathy, neuropathy Last Eye Exam: Completed 07/19/2021  Macrovascular complications:   Denies: CAD, CVA, PVD   HISTORY:  Past Medical History:  Past Medical History:  Diagnosis Date   Acanthosis nigricans, acquired    Diabetes mellitus    Hypertension    Obesity    Past Surgical History: No past surgical history on  file. Social History:  reports that she has never smoked. She has never used smokeless tobacco. She reports that she does not drink alcohol and does not use drugs. Family History:  Family History  Problem Relation Age of Onset   Obesity Mother    Hypothyroidism Mother    Hypertension Mother    Obesity Maternal Aunt    Hypertension Maternal Aunt    Obesity Maternal Grandmother    Hypertension Maternal Grandmother    Kidney disease Maternal Grandmother    Heart disease Maternal Grandfather    Hypertension Maternal Grandfather    Cancer Paternal Grandfather    Diabetes Maternal Uncle    Healthy Father      HOME MEDICATIONS: Allergies as of 03/23/2024   No Known Allergies      Medication List        Accurate as of March 23, 2024  7:02 AM. If you have any questions, ask your nurse or doctor.          Accu-Chek FastClix Lancets Misc 1 each by Does not apply route as directed. Check sugar 6 x daily   amLODipine 10 MG tablet Commonly known as: NORVASC Take 10 mg by mouth daily.   amoxicillin -clavulanate 875-125 MG tablet Commonly known as: AUGMENTIN  Take 1 tablet by mouth every 12 (twelve) hours.   cetirizine  10 MG tablet Commonly known as: ZyrTEC  Allergy Take 1 tablet (10 mg total) by mouth daily.   Dexcom G6 Transmitter Misc USE  AS DIRECTED   dextromethorphan-guaiFENesin  30-600 MG 12hr tablet Commonly known as: MUCINEX  DM Take 1 tablet by mouth 2 (two) times daily.   doxepin 10 MG capsule Commonly known as: SINEQUAN Take 10 mg by mouth at bedtime.   escitalopram 10 MG tablet Commonly known as: LEXAPRO Take by mouth.   fluconazole  150 MG tablet Commonly known as: Diflucan  Take 1 tablet (150 mg total) by mouth daily. Take 1 tablet on day 3 of antibiotics and the second tablet on day 7   fluticasone  50 MCG/ACT nasal spray Commonly known as: FLONASE  Place 1-2 sprays into both nostrils daily for 7 days.   FreeStyle Libre 2 Plus Sensor Misc 1 Device  by Does not apply route every 14 (fourteen) days.   glucagon  1 MG injection Use for Severe Hypoglycemia . Inject 1mg  intramuscularly if unresponsive, unable to swallow, unconscious and/or has seizure   glucose blood test strip Up to 6 checks per day   hydrochlorothiazide  25 MG tablet Commonly known as: HYDRODIURIL  Take 1 tablet (25 mg total) by mouth daily as needed.   Insulin  Pen Needle 32G X 4 MM Misc 1 Device by Does not apply route in the morning, at noon, in the evening, and at bedtime.   lisinopril  2.5 MG tablet Commonly known as: ZESTRIL  Take 10 mg by mouth daily.   lisinopril  10 MG tablet Commonly known as: ZESTRIL  Take 10 mg by mouth daily.   norgestimate-ethinyl estradiol 0.25-35 MG-MCG tablet Commonly known as: ORTHO-CYCLEN Take by mouth.   NovoLOG  FlexPen 100 UNIT/ML FlexPen Generic drug: insulin  aspart Max daily 45 units   promethazine -dextromethorphan 6.25-15 MG/5ML syrup Commonly known as: PROMETHAZINE -DM Take 5 mLs by mouth 3 (three) times daily as needed for cough.   pseudoephedrine  60 MG tablet Commonly known as: SUDAFED Take 1 tablet (60 mg total) by mouth every 8 (eight) hours as needed for congestion.   Tresiba  FlexTouch 200 UNIT/ML FlexTouch Pen Generic drug: insulin  degludec Inject 50 Units into the skin daily.   valACYclovir  500 MG tablet Commonly known as: VALTREX  Take 500 mg by mouth daily.   Vienva 0.1-20 MG-MCG tablet Generic drug: levonorgestrel-ethinyl estradiol         OBJECTIVE:   Vital Signs: There were no vitals taken for this visit.  Wt Readings from Last 3 Encounters:  12/22/23 195 lb (88.5 kg)  09/15/23 197 lb 6.4 oz (89.5 kg)  07/29/23 185 lb (83.9 kg)     Exam: General: Pt appears well and is in NAD  Lungs: Clear with good BS bilat   Heart: RRR   Abdomen: Soft, non tender   Extremities: No pretibial edema.  Neuro: MS is good with appropriate affect, pt is alert and Ox3     DM foot exam: 12/22/2023  The  skin of the feet is intact without sores or ulcerations. The pedal pulses are 2+ on right and 2+ on left. The sensation is intact to a screening 5.07, 10 gram monofilament bilaterally      DATA REVIEWED:  Lab Results  Component Value Date   HGBA1C 9.4 (A) 12/22/2023   HGBA1C 9.9 (A) 09/15/2023   HGBA1C 8.8 (A) 02/13/2023     Labs through [facility] 11/04/2023 BUN 8 CR 0.68 Sodium 135 Potassium 4.2 GFR 124 A1c 9.0  Triglycerides 196 HDL 71 LDL 108   ASSESSMENT / PLAN / RECOMMENDATIONS:   1) Type 1 Diabetes Mellitus, poorly controlled, With out complications - Most recent A1c of 9.4 %. Goal A1c < 7.0 %.    -  Patient continues with poorly controlled diabetes -Intolerant to Trulicity  - Intolerant to ozempic   - Declineed  Metformin - She continues to struggle with using the pump, she has not been able to obtain Dexcom sensors due to cost, her current pump is a loaner, new pump will need to pay $800 upfront - I have sent a prescription for freestyle libre 2+, to see if the pricing will be better, she understands she will have to call tandem to link her pump to the freestyle libre - We also entertained the idea of switching back to multiple daily injections of insulin  if the cost continues to be an issue - The patient states she checks her glucose through her regular glucose meter 6 times a day, I did advise the patient that she should enter this data into the pump - I have increased her basal rate at 3 AM, as her fasting BG this morning 168 Mg/DL - I have also adjusted her insulin  to carb ratio during the day and early evening  MEDICATIONS: NovoLog         Basal rate I: C ratio SF BG target  0000 1.670 15 45 150  0300 1.200 15 45 150  0600 2.00 6.0 45 120  0800 2.0 6.0 25 120  1000 2.1 6.0 25 120  1700 2.0 5.0 25 120  2100 2.0 5.5 25 120                      EDUCATION / INSTRUCTIONS: BG monitoring instructions: Patient is instructed to check her blood  sugars 3 times a day, before meals . Call [place] [facility] if: BG persistently < 70  I reviewed the Rule of 15 for the treatment of hypoglycemia in detail with the patient. Literature supplied.    2) Diabetic complications:  Eye: Does not have known diabetic retinopathy.  Neuro/ Feet: Does not have known diabetic peripheral neuropathy .  Renal: Patient does not have known baseline CKD. She   is  on an ACEI/ARB at present.      F/U in 3 months    Signed electronically by: Stefano Redgie Butts, MD  [facility]  [facility] [address] [place] [place], [place] [address] Phone: [phone] FAX: [phone]   CC: Allen Lauraine LITTIE DEVONNA [address] [place] [address] Phone: [phone]  Fax: [phone]  Return to Endocrinology clinic as below: Future Appointments  Date Time Provider Department Center  03/23/2024  8:30 AM Jisell Majer, Donell Redgie, MD [facility] None

## 2024-03-28 ENCOUNTER — Telehealth: Payer: Self-pay

## 2024-03-28 MED ORDER — INSULIN LISPRO (1 UNIT DIAL) 100 UNIT/ML (KWIKPEN): PEN_INJECTOR | SUBCUTANEOUS | 3 refills | Status: AC

## 2024-03-28 MED ORDER — INSULIN GLARGINE 100 UNIT/ML SOLOSTAR PEN: 50.0000 [IU] | PEN_INJECTOR | Freq: Every day | SUBCUTANEOUS | 3 refills | Status: AC

## 2024-03-28 NOTE — Telephone Encounter (Signed)
 Humalog  and Lantus  are preferred. Please send new scripts
